# Patient Record
Sex: Female | Born: 1975 | Race: Asian | Hispanic: No | Marital: Married | Smoking: Never smoker
Health system: Southern US, Community
[De-identification: ages and names within clinical notes are randomized; demographics above are authoritative.]

## PROBLEM LIST (undated history)

## (undated) DIAGNOSIS — K219 Gastro-esophageal reflux disease without esophagitis: Secondary | ICD-10-CM

## (undated) DIAGNOSIS — T7840XA Allergy, unspecified, initial encounter: Secondary | ICD-10-CM

## (undated) DIAGNOSIS — D649 Anemia, unspecified: Secondary | ICD-10-CM

## (undated) DIAGNOSIS — B1910 Unspecified viral hepatitis B without hepatic coma: Secondary | ICD-10-CM

## (undated) DIAGNOSIS — B181 Chronic viral hepatitis B without delta-agent: Secondary | ICD-10-CM

## (undated) DIAGNOSIS — Z6829 Body mass index (BMI) 29.0-29.9, adult: Secondary | ICD-10-CM

## (undated) HISTORY — DX: Unspecified viral hepatitis B without hepatic coma: B19.10

## (undated) HISTORY — DX: Anemia, unspecified: D64.9

## (undated) HISTORY — DX: Gastro-esophageal reflux disease without esophagitis: K21.9

## (undated) HISTORY — DX: Body mass index (BMI) 29.0-29.9, adult: Z68.29

## (undated) HISTORY — DX: Chronic viral hepatitis B without delta-agent: B18.1

## (undated) HISTORY — DX: Allergy, unspecified, initial encounter: T78.40XA

---

## 2008-08-04 ENCOUNTER — Encounter: Payer: Self-pay | Admitting: Family Medicine

## 2008-08-06 ENCOUNTER — Encounter: Payer: Self-pay | Admitting: Family Medicine

## 2009-02-07 ENCOUNTER — Ambulatory Visit: Payer: Self-pay | Admitting: Family Medicine

## 2009-02-07 ENCOUNTER — Encounter: Payer: Self-pay | Admitting: Family Medicine

## 2009-02-07 DIAGNOSIS — B181 Chronic viral hepatitis B without delta-agent: Secondary | ICD-10-CM | POA: Insufficient documentation

## 2009-02-07 DIAGNOSIS — E663 Overweight: Secondary | ICD-10-CM | POA: Insufficient documentation

## 2009-02-08 LAB — CONVERTED CEMR LAB
Alkaline Phosphatase: 64 units/L (ref 39–117)
CO2: 23 meq/L (ref 19–32)
Creatinine, Ser: 0.71 mg/dL (ref 0.40–1.20)
Direct LDL: 86 mg/dL
Eosinophils Absolute: 0.3 10*3/uL (ref 0.0–0.7)
Eosinophils Relative: 2 % (ref 0–5)
Glucose, Bld: 88 mg/dL (ref 70–99)
Hep B S Ab: NEGATIVE
Lymphocytes Relative: 30 % (ref 12–46)
MCHC: 30.8 g/dL (ref 30.0–36.0)
Monocytes Relative: 8 % (ref 3–12)
Platelets: 304 10*3/uL (ref 150–400)
RBC: 5.69 M/uL — ABNORMAL HIGH (ref 3.87–5.11)
Total Bilirubin: 0.4 mg/dL (ref 0.3–1.2)
WBC: 10.2 10*3/uL (ref 4.0–10.5)

## 2009-02-09 ENCOUNTER — Encounter: Payer: Self-pay | Admitting: Family Medicine

## 2009-02-09 ENCOUNTER — Ambulatory Visit: Payer: Self-pay | Admitting: Family Medicine

## 2009-02-15 LAB — CONVERTED CEMR LAB: Hepatitis B DNA (Calc): >64020000 copies/mL — ABNORMAL HIGH (ref <169)

## 2009-03-16 ENCOUNTER — Encounter: Payer: Self-pay | Admitting: Family Medicine

## 2009-03-22 ENCOUNTER — Encounter: Admission: RE | Admit: 2009-03-22 | Discharge: 2009-03-22 | Payer: Self-pay | Admitting: Gastroenterology

## 2009-03-31 ENCOUNTER — Encounter: Payer: Self-pay | Admitting: Family Medicine

## 2009-04-12 ENCOUNTER — Ambulatory Visit (HOSPITAL_COMMUNITY): Admission: RE | Admit: 2009-04-12 | Discharge: 2009-04-12 | Payer: Self-pay | Admitting: Gastroenterology

## 2009-04-13 ENCOUNTER — Ambulatory Visit (HOSPITAL_COMMUNITY): Admission: RE | Admit: 2009-04-13 | Discharge: 2009-04-13 | Payer: Self-pay | Admitting: Gastroenterology

## 2009-05-06 ENCOUNTER — Encounter: Payer: Self-pay | Admitting: Family Medicine

## 2009-06-10 ENCOUNTER — Encounter: Payer: Self-pay | Admitting: Family Medicine

## 2009-06-14 ENCOUNTER — Encounter: Payer: Self-pay | Admitting: Family Medicine

## 2009-06-14 ENCOUNTER — Ambulatory Visit: Payer: Self-pay | Admitting: Family Medicine

## 2009-06-15 ENCOUNTER — Encounter: Payer: Self-pay | Admitting: Family Medicine

## 2009-06-16 LAB — CONVERTED CEMR LAB
Antibody Screen: NEGATIVE
Basophils Relative: 1 % (ref 0–1)
Hepatitis B Surface Ag: POSITIVE — AB
Lymphocytes Relative: 28 % (ref 12–46)
Lymphs Abs: 2.9 10*3/uL (ref 0.7–4.0)
MCHC: 30.2 g/dL (ref 30.0–36.0)
Monocytes Relative: 7 % (ref 3–12)
Neutro Abs: 6.5 10*3/uL (ref 1.7–7.7)
Neutrophils Relative %: 63 % (ref 43–77)
RBC: 5.16 M/uL — ABNORMAL HIGH (ref 3.87–5.11)
Rubella: 72.7 intl units/mL — ABNORMAL HIGH
Sickle Cell Screen: NEGATIVE

## 2009-07-08 ENCOUNTER — Other Ambulatory Visit: Admission: RE | Admit: 2009-07-08 | Discharge: 2009-07-08 | Payer: Self-pay | Admitting: Family Medicine

## 2009-07-08 ENCOUNTER — Encounter: Payer: Self-pay | Admitting: Family Medicine

## 2009-07-08 ENCOUNTER — Ambulatory Visit: Payer: Self-pay | Admitting: Family Medicine

## 2009-07-08 LAB — CONVERTED CEMR LAB: GC Probe Amp, Genital: NEGATIVE

## 2009-07-28 ENCOUNTER — Ambulatory Visit: Payer: Self-pay | Admitting: Family Medicine

## 2009-08-11 ENCOUNTER — Encounter: Payer: Self-pay | Admitting: Family Medicine

## 2009-08-11 ENCOUNTER — Ambulatory Visit: Payer: Self-pay | Admitting: Family Medicine

## 2009-08-25 ENCOUNTER — Ambulatory Visit: Payer: Self-pay | Admitting: Family Medicine

## 2009-08-26 ENCOUNTER — Encounter: Payer: Self-pay | Admitting: Family Medicine

## 2009-08-26 ENCOUNTER — Ambulatory Visit (HOSPITAL_COMMUNITY): Admission: RE | Admit: 2009-08-26 | Discharge: 2009-08-26 | Payer: Self-pay | Admitting: Family Medicine

## 2009-09-09 ENCOUNTER — Encounter: Payer: Self-pay | Admitting: Family Medicine

## 2009-09-19 ENCOUNTER — Ambulatory Visit: Payer: Self-pay | Admitting: Family Medicine

## 2009-10-13 ENCOUNTER — Ambulatory Visit: Payer: Self-pay | Admitting: Family Medicine

## 2009-10-27 ENCOUNTER — Ambulatory Visit: Payer: Self-pay | Admitting: Family Medicine

## 2009-10-27 ENCOUNTER — Encounter: Payer: Self-pay | Admitting: Family Medicine

## 2009-10-27 LAB — CONVERTED CEMR LAB
ALT: 25 units/L (ref 0–35)
Albumin: 3.5 g/dL (ref 3.5–5.2)
Calcium: 9 mg/dL (ref 8.4–10.5)
Glucose, Bld: 166 mg/dL — ABNORMAL HIGH (ref 70–99)
MCHC: 31.5 g/dL (ref 30.0–36.0)
MCV: 73.2 fL — ABNORMAL LOW (ref 78.0–100.0)
Platelets: 274 10*3/uL (ref 150–400)
RBC: 4.73 M/uL (ref 3.87–5.11)
RDW: 14.7 % (ref 11.5–15.5)
Sodium: 136 meq/L (ref 135–145)
Total Bilirubin: 0.3 mg/dL (ref 0.3–1.2)
Total Protein: 7 g/dL (ref 6.0–8.3)

## 2009-10-31 ENCOUNTER — Ambulatory Visit: Payer: Self-pay | Admitting: Family Medicine

## 2009-10-31 ENCOUNTER — Encounter: Payer: Self-pay | Admitting: Family Medicine

## 2009-11-09 ENCOUNTER — Ambulatory Visit: Payer: Self-pay | Admitting: Family Medicine

## 2009-11-23 ENCOUNTER — Ambulatory Visit: Payer: Self-pay | Admitting: Family Medicine

## 2009-12-07 ENCOUNTER — Ambulatory Visit: Payer: Self-pay | Admitting: Family Medicine

## 2009-12-07 DIAGNOSIS — K219 Gastro-esophageal reflux disease without esophagitis: Secondary | ICD-10-CM | POA: Insufficient documentation

## 2009-12-28 ENCOUNTER — Ambulatory Visit: Payer: Self-pay | Admitting: Family Medicine

## 2009-12-28 ENCOUNTER — Encounter: Payer: Self-pay | Admitting: Family Medicine

## 2009-12-28 LAB — CONVERTED CEMR LAB

## 2009-12-29 ENCOUNTER — Encounter: Payer: Self-pay | Admitting: Family Medicine

## 2010-01-02 ENCOUNTER — Encounter: Payer: Self-pay | Admitting: Family Medicine

## 2010-01-05 ENCOUNTER — Ambulatory Visit: Payer: Self-pay | Admitting: Family Medicine

## 2010-01-17 ENCOUNTER — Ambulatory Visit: Payer: Self-pay | Admitting: Family Medicine

## 2010-01-24 ENCOUNTER — Ambulatory Visit: Payer: Self-pay | Admitting: Family Medicine

## 2010-01-29 ENCOUNTER — Inpatient Hospital Stay (HOSPITAL_COMMUNITY)
Admission: AD | Admit: 2010-01-29 | Discharge: 2010-01-31 | Payer: Self-pay | Source: Home / Self Care | Attending: Obstetrics & Gynecology | Admitting: Obstetrics & Gynecology

## 2010-02-01 ENCOUNTER — Ambulatory Visit: Payer: Self-pay | Admitting: Obstetrics and Gynecology

## 2010-02-02 ENCOUNTER — Ambulatory Visit: Payer: Self-pay

## 2010-02-02 ENCOUNTER — Encounter: Payer: Self-pay | Admitting: Family Medicine

## 2010-02-03 ENCOUNTER — Encounter: Payer: Self-pay | Admitting: Family Medicine

## 2010-02-26 ENCOUNTER — Encounter: Payer: Self-pay | Admitting: Gastroenterology

## 2010-03-07 NOTE — Assessment & Plan Note (Signed)
Summary: ob visit/eo   Vital Signs:  Patient profile:   35 year old female Weight:      136.4 pounds BP sitting:   105 / 74  Primary Care Provider:  [name]   History of Present Illness: Brenda Brady presents to clinic today at 29 weeks for routine OB check. Well no issues. Taking HepB medication See flowsheet.  Habits & Providers  Alcohol-Tobacco-Diet     Alcohol drinks/day: 0     Tobacco Status: never     Cigarette Packs/Day: n/a  Current Medications (verified): 1)  Prenatal/folic Acid  Tabs (Prenatal Vit-Fe Fumarate-Fa) .Marland Kitchen.. 1 Tab By Mouth Daily 2)  Tyzeka 600 Mg Tabs (Telbivudine) .Marland Kitchen.. 1 By Mouth Daily  Allergies (verified): No Known Drug Allergies  Past History:  Past Medical History: Last updated: 07/18/2009 Currently G3P2002 Hepatitis B (2004) ---  Managed by Dr. Santa Brady.  Was on anti-viral therapy that was stoped with pregnancy.  Will plan to restart Telbivudine or Lemivudine at start of 3rd trimester.   Past Surgical History: Last updated: 02/07/2009 None  Social History: Last updated: 02/07/2009 Immigrant from [place] in 04/2008. Lives with husband (Brenda Brady), son Brenda Brady), and daughter Brenda Brady). Unemployed No tobacco No EtOH No drugs No regular exercise Best Contact# [phone] Brenda Brady - cousin)  Review of Systems  The patient denies anorexia, fever, weight loss, and abdominal pain.    Physical Exam  General:  VS noted.  Well NAD   Impression & Recommendations:  Problem # 1:  PREGNANCY, MULTIGRAVIDA (ICD-V22.1) Doing well today at 29 weeks.  Plan to follow up in 2 weeks.  Orders: Flu Vaccine 38yrs + MEDICARE PATIENTS (Q2039) Medicaid OB visit - [facility] ([id number])  Problem # 2:  HEPATITIS B CARRIER (ICD-V02.61) On medication. No side effects. No issues. Plan follow up as above.  Avoid FSE.IUPC in labor and infant gets HepB vaccine and IVIG at delivery.   Complete Medication List: 1)  Prenatal/folic Acid Tabs (Prenatal vit-fe  fumarate-fa) .Marland Kitchen.. 1 tab by mouth daily 2)  Tyzeka 600 Mg Tabs (Telbivudine) .Marland Kitchen.. 1 by mouth daily   OB Initial Intake Information    Positive HCG by: self    Race: Oriental/Asian    Marital status: Married    Occupation: homemaker    Number of children at home: 2    Hospital of delivery: In [place]    Newborn's physician: [facility]  FOB Information    Husband/Father of baby: Brenda Brady    FOB occupation Works  Menstrual History    LMP (date): 04/20/2009    LMP - Character: normal    Menarche: 13 years    Menses interval: 28 days    On BCP's at conception: no   Flowsheet View for Follow-up Visit    Estimated weeks of       gestation:     29 0/7    Weight:     136.4    Blood pressure:   105 / 74    Headache:     No    Nausea/vomiting:   No    Edema:     0    Vaginal bleeding:   no    Vaginal discharge:   no    Fundal height:      29    FHR:       145    Fetal activity:     yes    Labor symptoms:   no    Taking prenatal vits?  Y    Smoking:     n/a    Next visit:     2 wk    Resident:     Brenda Brady    Preceptor:     Brenda Brady   Appended Document: ob visit/eo   Immunizations Administered:  Influenza Vaccine # 1:    Vaccine Type: Fluvax Non-MCR    Site: left deltoid    Mfr: GlaxoSmithKline    Dose: 0.5 ml    Route: IM    Given by: Brenda Brady CMA    Exp. Date: 08/02/2010    Lot #: [id number]    VIS given: 08/30/09 version given November 09, 2009.  Flu Vaccine Consent Questions:    Do you have a history of severe allergic reactions to this vaccine? no    Any prior history of allergic reactions to egg and/or gelatin? no    Do you have a sensitivity to the preservative Thimersol? no    Do you have a past history of Guillan-Barre Syndrome? no    Do you currently have an acute febrile illness? no    Have you ever had a severe reaction to latex? no    Vaccine information given and explained to patient? yes    Are you currently pregnant? yes

## 2010-03-07 NOTE — Assessment & Plan Note (Signed)
Summary: ob visit/eo   Vital Signs:  Patient profile:   35 year old female Weight:      126.8 pounds Temp:     98.2 degrees F oral Pulse rate:   83 / minute Pulse rhythm:   regular BP sitting:   107 / 68  (left arm) Cuff size:   regular  Vitals Entered By: Loralee Pacas CMA (July 28, 2009 11:26 AM)  Habits & Providers  Alcohol-Tobacco-Diet     Cigarette Packs/Day: n/a  Allergies: No Known Drug Allergies   Impression & Recommendations:  Problem # 1:  PREGNANCY, MULTIGRAVIDA (ICD-V22.1) Assessment Unchanged RTC in 4 weeks.  pt desires genetic screen.  ordered Quad screen for 16 weeks.  pt to try tums for nausea/vomitting.  anatomy scan to schedule for 18 weeks.  pt is following with GI for her Hep B.   Orders: Medicaid OB visit - [facility] ([id number]) Prenatal U/S > 14 weeks - 82956 (Prenatal U/S)Future Orders: AFP/Quad Scr-[facility] ([id number]) ... 07/20/2010  Complete Medication List: 1)  Prenatal/folic Acid Tabs (Prenatal vit-fe fumarate-fa) .Marland Kitchen.. 1 tab by mouth daily  Patient Instructions: 1)  It was nice to see you today. 2)  Please come back in 4 weeks for your appointment with Dr. [name] 3)  Please come in 2 weeks for a lab appointment for your genetic screen 4)  For your vomitting--try Tums when you have the acid feeling before you throw up. 5)  For your sneezing--you can use claritin or benedryl if it is bothering you.  benedryl may make you sleepy, take it at night.  Prenatal Visit Concerns noted: none     Flowsheet View for Follow-up Visit    Estimated weeks of       gestation:     14 1/7    Weight:     126.8    Blood pressure:   107 / 68    Headache:     No    Nausea/vomiting:   vom1/d    Edema:     0    Vaginal bleeding:   no    Vaginal discharge:   no    FHR:       160s    Labor symptoms:   no    Smoking:     n/a    Next visit:     4 wk    Resident:     Hulen Luster    Preceptor:     [place]     OB Initial Intake Information    Positive HCG by: self  Race: Oriental/Asian    Marital status: Married    Occupation: homemaker    Number of children at home: 2    Hospital of delivery: In [place]    Newborn's physician: [facility]  FOB Information    Husband/Father of baby: [name]    FOB occupation Works  Menstrual History    LMP (date): 04/20/2009    LMP - Character: normal    Menarche: 13 years    Menses interval: 28 days    On BCP's at conception: no   Appended Document: ob visit/eo I saw Brenda. Brenda Brady with Dr. Ayesha Mohair and agree with her care.  Brenda Brady is Hep BSAg + with high viral loads, asymptomatic and co-followed by GI.  discussed with OB service.  Pregnancy to be managed here with input from GI and delivery and infant care to follow HepBpositive guidelines.

## 2010-03-07 NOTE — Assessment & Plan Note (Signed)
Summary: f/u eo   Vital Signs:  Patient profile:   35 year old female Weight:      142 pounds Pulse rate:   92 / minute BP sitting:   97 / 67  (left arm) Cuff size:   regular  Vitals Entered By: Tessie Fass CMA (January 05, 2010 9:41 AM)  Primary Care Provider:  [name]  CC:  OB visit.  History of Present Illness: Ms Tory presents to clinic today at 73.1. She feels well and has no questions. See flowsheet.  Occasional non painful irregular contraction <34min. Not like labor.   Habits & Providers  Alcohol-Tobacco-Diet     Cigarette Packs/Day: n/a  Current Problems (verified): 1)  Esophageal Reflux  (ICD-530.81) 2)  Pregnancy, Multigravida  (ICD-V22.1) 3)  Health Maintenance Exam  (ICD-V70.0) 4)  Overweight  (ICD-278.02) 5)  Hepatitis B Carrier  (ICD-V02.61)  Current Medications (verified): 1)  Prenatal/folic Acid  Tabs (Prenatal Vit-Fe Fumarate-Fa) .Marland Kitchen.. 1 Tab By Mouth Daily 2)  Tyzeka 600 Mg Tabs (Telbivudine) .Marland Kitchen.. 1 By Mouth Daily 3)  Omeprazole 20 Mg Cpdr (Omeprazole) .Marland Kitchen.. 1 By Mouth Daily  Allergies (verified): No Known Drug Allergies  Past History:  Past Medical History: Last updated: 07/18/2009 Currently G3P2002 Hepatitis B (2004) ---  Managed by Dr. Santa Genera.  Was on anti-viral therapy that was stoped with pregnancy.  Will plan to restart Telbivudine or Lemivudine at start of 3rd trimester.   Family History: Last updated: 02/07/2009 Mother- HTN Father- Healthy  Social History: Last updated: 02/07/2009 Immigrant from [place] in 04/2008. Lives with husband ([name]), son Breianna Delfino), and daughter Iesha Summerhill). Unemployed No tobacco No EtOH No drugs No regular exercise Best Contact# [phone] [name] Villafuerte - cousin)  Risk Factors: Smoking Status: never (12/28/2009) Packs/Day: n/a (01/05/2010)  Review of Systems  The patient denies anorexia, fever, and weight loss.    Physical Exam  General:  VS noted.  Well NAD Abdomen:  36 cm.  Vertex Extremities:  Non edemetus BL LE   Impression & Recommendations:  Problem # 1:  PREGNANCY, MULTIGRAVIDA (ICD-V22.1) Assessment Unchanged  Doing well 37.1. Plan follow up in 1 week.  GBS pos. Will use PCN when in labor.  Labor precuations reviewed.   Orders: Medicaid OB visit - [facility] ([id number])  Problem # 2:  HEPATITIS B CARRIER (ICD-V02.61) Assessment: Unchanged On medication. No side effects. No issues. Plan follow up as above.  Avoid FSE/IUPC in labor and infant gets HepB vaccine and IVIG at delivery.  Liver enzymes WNL   Complete Medication List: 1)  Prenatal/folic Acid Tabs (Prenatal vit-fe fumarate-fa) .Marland Kitchen.. 1 tab by mouth daily 2)  Tyzeka 600 Mg Tabs (Telbivudine) .Marland Kitchen.. 1 by mouth daily 3)  Omeprazole 20 Mg Cpdr (Omeprazole) .Marland Kitchen.. 1 by mouth daily  Patient Instructions: 1)  Thank you for seeing me today. 2)  If you have vaginal bleeding, suspect your water has broken (large gush of fluid or continuous slow trickle from vagina) or you have more than 5 contractions per hour for 2 hours straight, please go directly to the MAU. 3)  Every morning and every evening evaluate whether you think the baby is moving enough.  If not, then do kick counts as follows.  Sit in a quiet place and count each movement.  If you get to 5 baby movements in an hour you can stop.  If at 1 hour you have less than 5 movements, keep counting for another hour.  If you don't get 10  movements in these 2 hours, you should go to the MAU or call the clinic. 4)  Come back in 1 week.   Orders Added: 1)  Medicaid OB visit - [facility] [[id number]]     Flowsheet View for Follow-up Visit    Estimated weeks of       gestation:     37 1/7    Weight:     142    Blood pressure:   97 / 67    Headache:     No    Nausea/vomiting:   No    Edema:     0    Vaginal bleeding:   no    Vaginal discharge:   d/c    Fundal height:      36    FHR:       160    Fetal activity:     yes    Labor symptoms:   few ctx    Fetal  position:     vertex    Taking prenatal vits?   Y    Smoking:     n/a    Next visit:     1 wk    Resident:     [name]

## 2010-03-07 NOTE — Assessment & Plan Note (Signed)
Summary: ob visit/eo   Vital Signs:  Patient profile:   35 year old female Weight:      139.9 pounds BP sitting:   125 / 79  Primary Care Provider:  [name]   History of Present Illness: Ms Brenda Brady presents to clinic today to for OB follow up.  She notes reflux presenting as substernal burning and  a sour taste in her mouth. She denies any chest pain or pressure or radiating pain. She denies any dyspnea. She states that her pain improves for a few hours with Tums.  Otherwise she feels well. See flowsheet.   Habits & Providers  Alcohol-Tobacco-Diet     Alcohol drinks/day: 0     Tobacco Status: never     Cigarette Packs/Day: n/a  Current Problems (verified): 1)  Esophageal Reflux  (ICD-530.81) 2)  Pregnancy, Multigravida  (ICD-V22.1) 3)  Health Maintenance Exam  (ICD-V70.0) 4)  Overweight  (ICD-278.02) 5)  Hepatitis B Carrier  (ICD-V02.61)  Current Medications (verified): 1)  Prenatal/folic Acid  Tabs (Prenatal Vit-Fe Fumarate-Fa) .Marland Kitchen.. 1 Tab By Mouth Daily 2)  Tyzeka 600 Mg Tabs (Telbivudine) .Marland Kitchen.. 1 By Mouth Daily 3)  Omeprazole 20 Mg Cpdr (Omeprazole) .Marland Kitchen.. 1 By Mouth Daily  Allergies (verified): No Known Drug Allergies  Past History:  Past Medical History: Last updated: 07/18/2009 Currently G3P2002 Hepatitis B (2004) ---  Managed by Dr. Santa Genera.  Was on anti-viral therapy that was stoped with pregnancy.  Will plan to restart Telbivudine or Lemivudine at start of 3rd trimester.   Past Surgical History: Last updated: 02/07/2009 None  Social History: Last updated: 02/07/2009 Immigrant from [place] in 04/2008. Lives with husband ([name]), son Rozlyn Yerby), and daughter Adaleah Forget). Unemployed No tobacco No EtOH No drugs No regular exercise Best Contact# [phone] [name] Hoopingarner - cousin)  Risk Factors: Smoking Status: never (12/07/2009) Packs/Day: n/a (12/07/2009)  Social History: Reviewed history from 02/07/2009 and no changes required. Immigrant from  [place] in 04/2008. Lives with husband ([name]), son Thurza Kwiecinski), and daughter Laelia Angelo). Unemployed No tobacco No EtOH No drugs No regular exercise Best Contact# [phone] (Ysuing Winegar - cousin)  Review of Systems  The patient denies anorexia, fever, weight loss, hoarseness, chest pain, syncope, dyspnea on exertion, prolonged cough, headaches, abdominal pain, severe indigestion/heartburn, genital sores, difficulty walking, and depression.    Physical Exam  General:  Vs noted.  Well NAD Eyes:  No corneal or conjunctival inflammation noted. EOMI. Vision grossly normal. Ears:  no external deformities.   Nose:  no external erythema.   Mouth:  good dentition and pharynx pink and moist.   Lungs:  Normal respiratory effort, chest expands symmetrically. Lungs are clear to auscultation, no crackles or wheezes. Heart:  Normal rate and regular rhythm. S1 and S2 normal without gallop, murmur, click, rub or other extra sounds. Abdomen:  Bowel sounds positive,abdomen soft and non-tender without masses, organomegaly or hernias noted. Abd 33 cm Extremities:  Non edemetus BL LE Cervical Nodes:  No lymphadenopathy noted Axillary Nodes:  No palpable lymphadenopathy   Impression & Recommendations:  Problem # 1:  PREGNANCY, MULTIGRAVIDA (ICD-V22.1)  Doing well at 33 weeks.  Plan to follow up in 2 week for OB visit.  See below for sepcific problems.  Orders: Medicaid OB visit - [facility] ([id number])  Problem # 2:  ESOPHAGEAL REFLUX (ICD-530.81)  Due to pregnancy.  Plan to start omeprazole and follow up in 2 weeks. Discussed indications of this medication and red flags with patient who expressed  understanding. Used Nurse, learning disability.  Her updated medication list for this problem includes:    Omeprazole 20 Mg Cpdr (Omeprazole) .Marland Kitchen... 1 by mouth daily  Orders: Medicaid OB visit - [facility] ([id number])  Problem # 3:  HEPATITIS B CARRIER (ICD-V02.61)  On medication. No side effects. No issues. Plan follow up as  above.  Avoid FSE/IUPC in labor and infant gets HepB vaccine and IVIG at delivery.  Liver enzymes WNL last months.   Orders: Medicaid OB visit - [facility] ([id number])  Complete Medication List: 1)  Prenatal/folic Acid Tabs (Prenatal vit-fe fumarate-fa) .Marland Kitchen.. 1 tab by mouth daily 2)  Tyzeka 600 Mg Tabs (Telbivudine) .Marland Kitchen.. 1 by mouth daily 3)  Omeprazole 20 Mg Cpdr (Omeprazole) .Marland Kitchen.. 1 by mouth daily Prescriptions: OMEPRAZOLE 20 MG CPDR (OMEPRAZOLE) 1 by mouth daily  #30 x 6   Entered and Authorized by:   [name]   Signed by:   [name] on 12/07/2009   Method used:   [facility] to        [facility] [address] [phone]* (retail)       [address]       [place], [place]  [address]       Ph: [phone] or [phone]       Fax: [phone]   RxID:   [id number] PRENATAL/FOLIC ACID  TABS (PRENATAL VIT-FE FUMARATE-FA) 1 tab by mouth daily  #30 x 12   Entered and Authorized by:   [name]   Signed by:   [name] on 12/07/2009   Method used:   [facility] to        [facility] [address] [phone]* (retail)       [address]       [place], [place]  [address]       Ph: [phone] or [phone]       Fax: [phone]   RxID:   [id number]    Orders Added: 1)  Medicaid OB visit - [facility] [[id number]]     OB Initial Intake Information    Positive HCG by: self    Race: Oriental/Asian    Marital status: Married    Occupation: homemaker    Number of children at home: 2    Hospital of delivery: In [place]    Newborn's physician: [facility]  FOB Information    Husband/Father of baby: [name]    FOB occupation Works  Menstrual History    LMP (date): 04/20/2009    LMP - Character: normal    Menarche: 13 years    Menses interval: 28 days    On BCP's at conception: no   Flowsheet View for Follow-up Visit    Estimated weeks of       gestation:     33 0/7    Weight:     139.9    Blood pressure:   125 / 79     Headache:     No    Nausea/vomiting:   No    Edema:     0    Vaginal bleeding:   no    Vaginal discharge:   no    Fetal activity:     yes    Labor symptoms:   no    Smoking:     n/a    Next visit:     2 wk    Resident:  [name]

## 2010-03-07 NOTE — Assessment & Plan Note (Signed)
Summary: ob visit/eo   Vital Signs:  Patient profile:   35 year old female Height:      58.25 inches Weight:      133 pounds Pulse rate:   96 / minute BP sitting:   96 / 76  (left arm) Cuff size:   regular  Vitals Entered By: Tessie Fass CMA (October 13, 2009 9:02 AM) CC: OB Visit Pain Assessment Patient in pain? no        Primary Care Provider:  [name]  CC:  OB Visit.  History of Present Illness: Ob visit 25.1 weeks. No issue doing well.   Habits & Providers  Alcohol-Tobacco-Diet     Alcohol drinks/day: 0     Tobacco Status: never  Current Problems (verified): 1)  Pregnancy, Multigravida  (ICD-V22.1) 2)  Health Maintenance Exam  (ICD-V70.0) 3)  Overweight  (ICD-278.02) 4)  Hepatitis B Carrier  (ICD-V02.61)  Current Medications (verified): 1)  Prenatal/folic Acid  Tabs (Prenatal Vit-Fe Fumarate-Fa) .Marland Kitchen.. 1 Tab By Mouth Daily 2)  Tyzeka 600 Mg Tabs (Telbivudine) .Marland Kitchen.. 1 By Mouth Daily  Allergies (verified): No Known Drug Allergies  Past History:  Past Medical History: Last updated: 07/18/2009 Currently G3P2002 Hepatitis B (2004) ---  Managed by Dr. Santa Genera.  Was on anti-viral therapy that was stoped with pregnancy.  Will plan to restart Telbivudine or Lemivudine at start of 3rd trimester.   Family History: Last updated: 02/07/2009 Mother- HTN Father- Healthy  Social History: Last updated: 02/07/2009 Immigrant from [place] in 04/2008. Lives with husband ([name]), son Willa Brocks), and daughter Parisha Beaulac). Unemployed No tobacco No EtOH No drugs No regular exercise Best Contact# [phone] [name] [place] - cousin)  Risk Factors: Smoking Status: never (10/13/2009) Packs/Day: n/a (09/19/2009)  Review of Systems       See flowsheet no issues.   Physical Exam  General:  Vs noted and recheck.    Impression & Recommendations:  Problem # 1:  PREGNANCY, MULTIGRAVIDA (ICD-V22.1)  Doing well no issues.  Plan RTC in 2 weeks for labs and  in 4 for a MD visit.   Orders: Medicaid OB visit - [facility] ([id number])Future Orders: CBC-[facility] ([id number]) ... 11/04/2010 HIV-[facility] ([id number]) ... 11/04/2010 RPR-[facility] [id number]) ... 11/04/2010  Problem # 2:  HEPATITIS B CARRIER (ICD-V02.61)  Plan to start Telbivudine 600 once daily per GI today and continue through pregnancy.   Future Orders: Comp Met-[facility] [id number]) ... 11/04/2010  Complete Medication List: 1)  Prenatal/folic Acid Tabs (Prenatal vit-fe fumarate-fa) .Marland Kitchen.. 1 tab by mouth daily 2)  Tyzeka 600 Mg Tabs (Telbivudine) .Marland Kitchen.. 1 by mouth daily Prescriptions: TYZEKA 600 MG TABS (TELBIVUDINE) 1 by mouth daily  #30 x 6   Entered and Authorized by:   [name]   Signed by:   [name] on 10/13/2009   Method used:   [facility]ically to        [facility] [address] [phone]* (retail)       [address]       [place], [place]  [address]       Ph: [phone] or [phone]       Fax: [phone]   RxID:   [id number]    OB Initial Intake Information    Positive HCG by: self    Race: Oriental/Asian    Marital status: Married    Occupation: homemaker    Number of children at home: 2    Hospital of delivery:  In [place]    Newborn's physician: [facility]  FOB Information    Husband/Father of baby: [name]    FOB occupation Works  Menstrual History    LMP (date): 04/20/2009    LMP - Character: normal    Menarche: 13 years    Menses interval: 28 days    On BCP's at conception: no   Flowsheet View for Follow-up Visit    Estimated weeks of       gestation:     25 1/7    Weight:     133    Blood pressure:   96 / 76   Appended Document: ob visit/eo    Clinical Lists Changes  Orders: Added new Test order of Glucose 1 hr-[facility] ([id number]) - Signed

## 2010-03-07 NOTE — Consult Note (Signed)
Summary: [facility]   Imported By: De Nurse 04/13/2009 13:40:32  _____________________________________________________________________  External Attachment:    Type:   Image     Comment:   External Document  Appended Document: [facility] Reviewed.  Pending liver bx.

## 2010-03-07 NOTE — Consult Note (Signed)
Summary: [facility]   Imported By: De Nurse 07/13/2009 10:36:45  _____________________________________________________________________  External Attachment:    Type:   Image     Comment:   External Document  Appended Document: [facility] Reviewed and will forward to Dr. [name] to review management directions.  Appended Document: Update to [facility] to reflect OB plan     Allergies: No Known Drug Allergies  Past History:  Past Medical History: Currently G3P2002 Hepatitis B (2004) ---  Managed by Dr. Santa Genera.  Was on anti-viral therapy that was stoped with pregnancy.  Will plan to restart Telbivudine or Lemivudine at start of 3rd trimester.    Complete Medication List: 1)  Prenatal/folic Acid Tabs (Prenatal vit-fe fumarate-fa) .Marland Kitchen.. 1 tab by mouth daily

## 2010-03-07 NOTE — Assessment & Plan Note (Signed)
Summary: ob visit/eo   Vital Signs:  Patient profile:   35 year old female Height:      58.25 inches Weight:      137.6 pounds Temp:     97.6 degrees F oral Pulse rate:   111 / minute BP sitting:   114 / 80  Vitals Entered By: Jimmy Footman, CMA (November 23, 2009 9:24 AM)  Primary Care Provider:  [name]   History of Present Illness: Feels well no issues today.  Taking anti virals daily as well as PNV. See flowsheet.   Habits & Providers  Alcohol-Tobacco-Diet     Alcohol drinks/day: 0     Tobacco Status: never     Cigarette Packs/Day: n/a  Current Problems (verified): 1)  Pregnancy, Multigravida  (ICD-V22.1) 2)  Health Maintenance Exam  (ICD-V70.0) 3)  Overweight  (ICD-278.02) 4)  Hepatitis B Carrier  (ICD-V02.61)  Current Medications (verified): 1)  Prenatal/folic Acid  Tabs (Prenatal Vit-Fe Fumarate-Fa) .Marland Kitchen.. 1 Tab By Mouth Daily 2)  Tyzeka 600 Mg Tabs (Telbivudine) .Marland Kitchen.. 1 By Mouth Daily  Allergies (verified): No Known Drug Allergies  Past History:  Past Medical History: Last updated: 07/18/2009 Currently G3P2002 Hepatitis B (2004) ---  Managed by Dr. Santa Genera.  Was on anti-viral therapy that was stoped with pregnancy.  Will plan to restart Telbivudine or Lemivudine at start of 3rd trimester.   Past Surgical History: Last updated: 02/07/2009 None  Family History: Last updated: 02/07/2009 Mother- HTN Father- Healthy  Social History: Last updated: 02/07/2009 Immigrant from [place] in 04/2008. Lives with husband ([name]), son Sila Sarsfield), and daughter Kerria Sapien). Unemployed No tobacco No EtOH No drugs No regular exercise Best Contact# [phone] [name] Lortz - cousin)  Risk Factors: Smoking Status: never (11/23/2009) Packs/Day: n/a (11/23/2009)  Review of Systems       See flowsheet  Physical Exam  General:  VS noted.  Well woman in NAD Eyes:  No scleral icterus Abdomen:  Bowel sounds positive,abdomen soft and non-tender without  masses, organomegaly or hernias noted. Abd 30cm   Impression & Recommendations:  Problem # 1:  PREGNANCY, MULTIGRAVIDA (ICD-V22.1) Assessment Unchanged  Well woman.  No issues today.  Follow up 2 weeks.    Orders: Medicaid OB visit - [facility] ([id number])  Problem # 2:  HEPATITIS B CARRIER (ICD-V02.61) On medication. No side effects. No issues. Plan follow up as above.  Avoid FSE.IUPC in labor and infant gets HepB vaccine and IVIG at delivery.   Complete Medication List: 1)  Prenatal/folic Acid Tabs (Prenatal vit-fe fumarate-fa) .Marland Kitchen.. 1 tab by mouth daily 2)  Tyzeka 600 Mg Tabs (Telbivudine) .Marland Kitchen.. 1 by mouth daily  Patient Instructions: 1)  Thank you for seeing me today. 2)  Due date is 01/25/10 3)  If you have vaginal bleeding, suspect your water has broken (large gush of fluid or continuous slow trickle from vagina) or you have more than 5 contractions per hour for 2 hours straight, please go directly to the MAU. 4)  Every morning and every evening evaluate whether you think the baby is moving enough.  If not, then do kick counts as follows.  Sit in a quiet place and count each movement.  If you get to 5 baby movements in an hour you can stop.  If at 1 hour you have less than 5 movements, keep counting for another hour.  If you don't get 10 movements in these 2 hours, you should go to the MAU or call the clinic. 5)  Please schedule a follow-up appointment in 2 weeks.    Orders Added: 1)  Medicaid OB visit - [facility] [[id number]]     Flowsheet View for Follow-up Visit    Estimated weeks of       gestation:     31 0/7    Weight:     137.6    Blood pressure:   114 / 80    Fetal activity:       yes    Labor symptoms?   no    Smoking PPD:   n/a   Flowsheet View for Follow-up Visit    Estimated weeks of       gestation:     31 0/7    Weight:     137.6    Blood pressure:   114 / 80    Fetal activity:     yes    Labor symptoms:   no    Smoking:     n/a   OB Initial Intake Information     Positive HCG by: self    Race: Oriental/Asian    Marital status: Married    Occupation: homemaker    Number of children at home: 2    Hospital of delivery: In [place]    Newborn's physician: [facility]  FOB Information    Husband/Father of baby: [name]    FOB occupation Works  Menstrual History    LMP (date): 04/20/2009    LMP - Character: normal    Menarche: 13 years    Menses interval: 28 days    On BCP's at conception: no

## 2010-03-07 NOTE — Letter (Signed)
Summary: Handout Printed  Printed Handout:  - Prenatal-Record-[facility] 

## 2010-03-07 NOTE — Assessment & Plan Note (Signed)
Summary: ob visit,tcb   Vitals Entered By: Arlyss Repress CMA, (September 19, 2009 2:34 PM)  Primary Care Provider:  [name]   History of Present Illness: Ms Eisel presents to clinic today for an OB follow up. She has seen her GI doctor in the interum who is following in regards to her HepB. Plan to Start Telbivudine 600 daily at start of 3rd trimester. Will obtain HEP HBV Quantasure, HBe antigen, and CBC 1 month following initiation of Telbivudine.  OB doing well. See flowsheet.  Habits & Providers  Alcohol-Tobacco-Diet     Alcohol drinks/day: 0     Tobacco Status: never     Cigarette Packs/Day: n/a  Current Problems (verified): 1)  Pregnancy, Multigravida  (ICD-V22.1) 2)  Health Maintenance Exam  (ICD-V70.0) 3)  Overweight  (ICD-278.02) 4)  Hepatitis B Carrier  (ICD-V02.61)  Current Medications (verified): 1)  Prenatal/folic Acid  Tabs (Prenatal Vit-Fe Fumarate-Fa) .Marland Kitchen.. 1 Tab By Mouth Daily  Allergies (verified): No Known Drug Allergies  Past History:  Past Medical History: Reviewed history from 07/18/2009 and no changes required. Currently [id number] Hepatitis B (2004) ---  Managed by Dr. Santa Genera.  Was on anti-viral therapy that was stoped with pregnancy.  Will plan to restart Telbivudine or Lemivudine at start of 3rd trimester.   Past Surgical History: Reviewed history from 02/07/2009 and no changes required. None  Family History: Reviewed history from 02/07/2009 and no changes required. Mother- HTN Father- Healthy  Social History: Reviewed history from 02/07/2009 and no changes required. Immigrant from [place] in 04/2008. Lives with husband ([name]), son Barbie Croston), and daughter Pierrette Scheu). Unemployed No tobacco No EtOH No drugs No regular exercise Best Contact# [phone] (Ysuing Ericsson - cousin)  Review of Systems  The patient denies anorexia, weight loss, hoarseness, headaches, hemoptysis, abdominal pain, genital sores, and muscle weakness.     Physical Exam  General:  VS noted.  Well NAD Eyes:  No corneal or conjunctival inflammation noted. EOMI. Perrla.  Vision grossly normal. Lungs:  Normal respiratory effort, chest expands symmetrically. Lungs are clear to auscultation, no crackles or wheezes. Heart:  Normal rate and regular rhythm. S1 and S2 normal without gallop, murmur, click, rub or other extra sounds. Abdomen:  Bowel sounds positive,abdomen soft and non-tender without masses, organomegaly or hernias noted. Cervical Nodes:  No lymphadenopathy noted Axillary Nodes:  No palpable lymphadenopathy   Impression & Recommendations:  Problem # 1:  PREGNANCY, MULTIGRAVIDA (ICD-V22.1) Assessment Unchanged  Doing well. No issues other than HepB as below. Plan to follow up in 4 weeks.   Orders: Medicaid OB visit - [facility] ([id number])  Problem # 2:  HEPATITIS B CARRIER (ICD-V02.61) Assessment: Unchanged Plan to Start Telbivudine 600 daily at start of 3rd trimester. Will obtain HEP HBV Quantasure, HBe antigen, and CBC 1 month following initiation of Telbivudine. Infant gets Hb IgG and HepB vaccine at birth.  Complete Medication List: 1)  Prenatal/folic Acid Tabs (Prenatal vit-fe fumarate-fa) .Marland Kitchen.. 1 tab by mouth daily   OB Initial Intake Information    Positive HCG by: self    Race: Oriental/Asian    Marital status: Married    Occupation: homemaker    Number of children at home: 2    Hospital of delivery: In [place]    Newborn's physician: [facility]  FOB Information    Husband/Father of baby: [name]    FOB occupation Works  Menstrual History    LMP (date): 04/20/2009    LMP - Character: normal  Menarche: 13 years    Menses interval: 28 days    On BCP's at conception: no   Flowsheet View for Follow-up Visit    Estimated weeks of       gestation:     21 5/7    Headache:     No    Nausea/vomiting:   nausea    Edema:     0    Vaginal bleeding:   no    Vaginal discharge:   no    Fundal height:      21    FHR:        150    Fetal activity:     yes    Labor symptoms:   no    Fetal position:     ??    Taking prenatal vits?   Y    Smoking:     n/a    Next visit:     4 weeks    Resident:     [name]    Preceptor:     Mauricio Po

## 2010-03-07 NOTE — Assessment & Plan Note (Signed)
Summary: 7 5/7 wk new OB   Vital Signs:  Patient profile:   35 year old female Height:      58.25 inches Weight:      125.5 pounds BMI:     26.10 Pulse rate:   90 / minute BP sitting:   107 / 74  (left arm) Cuff size:   regular  Vitals Entered By: Gladstone Pih (Jun 14, 2009 8:47 AM) CC: ? Preg Is Patient Diabetic? No Pain Assessment Patient in pain? no        Primary Care Provider:  Marisue Ivan, MD  CC:  ? Preg.  History of Present Illness: 35yo F here b/c of positive home pregnancy test  Pregnancy: Positive home preg test on 05/27/2009.  LMP 04/20/2009.  Hx of regular periods.  She was previous G2P2002, all NSVD w/ a hx of positive Hepatitis B not on any medications but seen by the Heptologist in town.  Denies any abd/pelvic pain or vaginal bleeding.  Does endorse N/V.  Not on any medications currently.  Habits & Providers  Alcohol-Tobacco-Diet     Tobacco Status: never  Current Medications (verified): 1)  Prenatal/folic Acid  Tabs (Prenatal Vit-Fe Fumarate-Fa) .Marland Kitchen.. 1 Tab By Mouth Daily  Allergies (verified): No Known Drug Allergies  Past History:  Past Medical History: Currently G3P2002 Hepatitis B (2004)  Review of Systems      See HPI  Physical Exam  General:  VS Reviewed. Well appearing, NAD.  Lungs:  Normal respiratory effort, chest expands symmetrically. Lungs are clear to auscultation, no crackles or wheezes. Heart:  Normal rate and regular rhythm. S1 and S2 normal without gallop, murmur, click, rub or other extra sounds. Abdomen:  Soft, NT, ND, no HSM, active BS  Extremities:  no edema Neurologic:  no focal deficits Psych:  no signs or complaints of depression   Impression & Recommendations:  Problem # 1:  PREGNANCY, MULTIGRAVIDA (ICD-V22.1) Assessment New 35yo G3P2002 at 7 5/7 wks per LMP (04/20/2009) EDD: 01/25/2010 Positive Upreg at home (05/27/09) and positive upreg in the office today. Plan to obtain all prenatal labs  today. Started on prenatal vitamins. Will f/u with Dr. [name] for initial ob visit. Will need GC/Chl at that time.  Pap smear up to date (07/2008 nl)  Orders: Prenatal-[facility] ([id number]) Urine Culture-[facility] ([id number]) HIV-[facility] ([id number]) Sickle Cell Scr-[facility] ([id number]) Medicaid OB visit - [facility] ([id number])  Complete Medication List: 1)  Prenatal/folic Acid Tabs (Prenatal vit-fe fumarate-fa) .Marland Kitchen.. 1 tab by mouth daily  Other Orders: U Preg-[facility] ([id number])  Patient Instructions: 1)  Today you are 7 wks and 5 days. 2)  Schedule f/u appt with an intern in 1-2 weeks. 3)  They will discuss your blood work at that time. 4)  Call us if you have any problems or concerns. 5)  Start taking the prenatal vitamin today. Prescriptions: PRENATAL/FOLIC ACID  TABS (PRENATAL VIT-FE FUMARATE-FA) 1 tab by mouth daily  #100 x 11   Entered and Authorized by:   Marisue Ivan  MD   Signed by:   Marisue Ivan  MD on 06/14/2009   Method used:   Electronically to        [facility] [address] [phone]* (retail)       [address]       [place], [place]  [address]       Ph: [phone] or [phone]       Fax: [phone]   RxID:  [id number]    Laboratory Results   Urine Tests  Date/Time Received: Jun 14, 2009 8:53 AM  Date/Time Reported: Jun 14, 2009 9:03 AM     Urine HCG: positive Comments: ...............test performed by......Marland KitchenBonnie A. [place], MLS (ASCP)cm

## 2010-03-07 NOTE — Consult Note (Signed)
Summary: [facility]  [facility]   Imported By: De Nurse 05/16/2009 16:06:34  _____________________________________________________________________  External Attachment:    Type:   Image     Comment:   External Document  Appended Document: [facility] Reviewed.

## 2010-03-07 NOTE — Assessment & Plan Note (Signed)
Summary: ob visit/eo   Vital Signs:  Patient profile:   35 year old female Weight:      130 pounds BP sitting:   106 / 71  Vitals Entered By: Arlyss Repress CMA, (August 25, 2009 1:40 PM)  Primary Care Provider:  [name]  CC:  OB Visit.  History of Present Illness: Routine OB visit at 18 weeks. Doing well only mild heartburn. Has appt with hepatoligist Aug 1st for further eval and management of HepB. See flowsheet.  Habits & Providers  Alcohol-Tobacco-Diet     Alcohol drinks/day: 0     Tobacco Status: never     Cigarette Packs/Day: n/a  Current Problems (verified): 1)  Pregnancy, Multigravida  (ICD-V22.1) 2)  Health Maintenance Exam  (ICD-V70.0) 3)  Overweight  (ICD-278.02) 4)  Hepatitis B Carrier  (ICD-V02.61)  Current Medications (verified): 1)  Prenatal/folic Acid  Tabs (Prenatal Vit-Fe Fumarate-Fa) .Marland Kitchen.. 1 Tab By Mouth Daily  Allergies (verified): No Known Drug Allergies  Past History:  Past Medical History: Last updated: 07/18/2009 Currently G3P2002 Hepatitis B (2004) ---  Managed by Dr. Santa Genera.  Was on anti-viral therapy that was stoped with pregnancy.  Will plan to restart Telbivudine or Lemivudine at start of 3rd trimester.   Family History: Last updated: 02/07/2009 Mother- HTN Father- Healthy  Social History: Last updated: 02/07/2009 Immigrant from [place] in 04/2008. Lives with husband ([name]), son Rhema Boyett), and daughter Jaxyn Mestas). Unemployed No tobacco No EtOH No drugs No regular exercise Best Contact# [phone] [name] Bergevin - cousin)  Risk Factors: Smoking Status: never (08/25/2009) Packs/Day: n/a (08/25/2009)  Review of Systems       see flowsheet and hpi otherwise negative.  Physical Exam  General:  VS noted. Well see fhowsheet   Impression & Recommendations:  Problem # 1:  PREGNANCY, MULTIGRAVIDA (ICD-V22.1) Assessment Unchanged Awaiting quad screen results. Doing well no current issues. Will get [name] arranged at  [facility] and follow up results. HepB below. RTC 4 weeks. Glucola at 26 weeks.  Orders: Ultrasound (Ultrasound) Medicaid OB visit - [facility] ([id number])  Problem # 2:  HEPATITIS B CARRIER (ICD-V02.61) Assessment: Unchanged Chronic Hep B managed by Hepatoligy.  Pt has appt Aug 1st. Will follow. Planning on antiviral agents in 3rd trimester. Will review case with OB/GYN at 36 weeks in preperation for labor and delivery.   Complete Medication List: 1)  Prenatal/folic Acid Tabs (Prenatal vit-fe fumarate-fa) .Marland Kitchen.. 1 tab by mouth daily  Patient Instructions: 1)  Thank you for seeing me today. 2)  Please follow up with me in 4 weeks. 3)  If you have vaginal bleeding, suspect your water has broken (large gush of fluid or continuous slow trickle from vagina) or you have more than 5 contractions per hour for 1 hour straight, please go directly to the MAU.   OB Initial Intake Information    Positive HCG by: self    Race: Oriental/Asian    Marital status: Married    Occupation: homemaker    Number of children at home: 2    Hospital of delivery: In [place]    Newborn's physician: [facility]  FOB Information    Husband/Father of baby: [name]    FOB occupation Works  Menstrual History    LMP (date): 04/20/2009    LMP - Character: normal    Menarche: 13 years    Menses interval: 28 days    On BCP's at conception: no   Flowsheet View for Follow-up Visit    Estimated  weeks of       gestation:     18 1/7    Weight:     130    Blood pressure:   106 / 71    Headache:     No    Nausea/vomiting:   nausea    Edema:     0    Vaginal bleeding:   no    Vaginal discharge:   no    Fundal height:      18    FHR:       162    Fetal activity:     yes    Labor symptoms:   no    Fetal position:     ??    Taking prenatal vits?   Y    Smoking:     n/a    Next visit:     4 weeks    Resident:     [name]    Preceptor:     Sheffield Slider

## 2010-03-07 NOTE — Assessment & Plan Note (Signed)
Summary: ob visit/eo   Vital Signs:  Patient profile:   35 year old female Weight:      138.8 pounds BP sitting:   113 / 76  Vitals Entered By: Arlyss Repress CMA, (December 28, 2009 10:48 AM)  Primary Care Provider:  [name]  CC:  Ob visit.  History of Present Illness: Ms Azaleah is here for her 36 week OB visit. She feels well and has no complaints.  See flowsheet.  Mild short irreg contractions. Does not feel like labor.  Is taking her antivirals, omeprazole, and PNV. Happy Translator used today.  Habits & Providers  Alcohol-Tobacco-Diet     Alcohol drinks/day: 0     Tobacco Status: never     Cigarette Packs/Day: n/a  Current Problems (verified): 1)  Vaginal Discharge  (ICD-623.5) 2)  Esophageal Reflux  (ICD-530.81) 3)  Pregnancy, Multigravida  (ICD-V22.1) 4)  Health Maintenance Exam  (ICD-V70.0) 5)  Overweight  (ICD-278.02) 6)  Hepatitis B Carrier  (ICD-V02.61)  Current Medications (verified): 1)  Prenatal/folic Acid  Tabs (Prenatal Vit-Fe Fumarate-Fa) .Marland Kitchen.. 1 Tab By Mouth Daily 2)  Tyzeka 600 Mg Tabs (Telbivudine) .Marland Kitchen.. 1 By Mouth Daily 3)  Omeprazole 20 Mg Cpdr (Omeprazole) .Marland Kitchen.. 1 By Mouth Daily  Allergies (verified): No Known Drug Allergies  Past History:  Past Medical History: Last updated: 07/18/2009 Currently G3P2002 Hepatitis B (2004) ---  Managed by Dr. Santa Genera.  Was on anti-viral therapy that was stoped with pregnancy.  Will plan to restart Telbivudine or Lemivudine at start of 3rd trimester.   Past Surgical History: Last updated: 02/07/2009 None  Family History: Last updated: 02/07/2009 Mother- HTN Father- Healthy  Social History: Last updated: 02/07/2009 Immigrant from [place] in 04/2008. Lives with husband ([name]), son Jordanna Hendrie), and daughter Modell Fendrick). Unemployed No tobacco No EtOH No drugs No regular exercise Best Contact# [phone] [name] Friesenhahn - cousin)  Review of Systems  The patient denies anorexia, fever, and weight  loss.    Physical Exam  General:  Vs noted.  Well gravid woman Lungs:  Normal respiratory effort, chest expands symmetrically. Lungs are clear to auscultation, no crackles or wheezes. Heart:  Normal rate and regular rhythm. S1 and S2 normal without gallop, murmur, click, rub or other extra sounds. Abdomen:  Gravid at 36cm.  Genitalia:  normal introitus, no external lesions, mucosa pink and moist, no vaginal or cervical lesions, no vaginal atrophy, no friaility or hemorrhage, and normal uterus size and position.  White cheezy discharge noted.  Extremities:  Non edemetus BL LE Additional Exam:  [name]: Fetus vertex with Hr 140s. Moveing.    Impression & Recommendations:  Problem # 1:  PREGNANCY, MULTIGRAVIDA (ICD-V22.1) Assessment Unchanged Doing well today at 36 weeks.  GBS, GC/CHL done today.  Will follow up in 1 week. Gave red flags and labor precautions today. Orders: GC/Chlamydia-[facility] ([id number]) Grp B Probe-[facility] ([id number]) Medicaid OB visit - [facility] ([id number])  Problem # 2:  VAGINAL DISCHARGE (ICD-623.5) Assessment: New Not very impressive. Few yeast seen. Not symptomatic. Will offer treatment at the next if she becomes symptomatic by then. [facility]  Problem # 3:  HEPATITIS B CARRIER (ICD-V02.61) Assessment: Unchanged On medication. No side effects. No issues. Plan follow up as above.  Avoid FSE/IUPC in labor and infant gets HepB vaccine and IVIG at delivery.  Liver enzymes WNL last month.   Complete Medication List: 1)  Prenatal/folic Acid Tabs (Prenatal vit-fe fumarate-fa) .Marland Kitchen.. 1 tab by mouth daily 2)  Tyzeka  600 Mg Tabs (Telbivudine) .Marland Kitchen.. 1 by mouth daily 3)  Omeprazole 20 Mg Cpdr (Omeprazole) .Marland Kitchen.. 1 by mouth daily  Patient Instructions: 1)  Thank you for seeing me today. 2)  Please schedule a follow-up appointment in 1 weeks.  3)  If you have vaginal bleeding, suspect your water has broken (large gush of fluid or continuous slow trickle from  vagina) or you have more than 5 contractions per hour for 2 hours straight, please go directly to the MAU. 4)  Every morning and every evening evaluate whether you think the baby is moving enough.  If not, then do kick counts as follows.  Sit in a quiet place and count each movement.  If you get to 5 baby movements in an hour you can stop.  If at 1 hour you have less than 5 movements, keep counting for another hour.  If you don't get 10 movements in these 2 hours, you should go to the MAU or call the clinic. 5)  Keep taking your medicines.   Orders Added: 1)  GC/Chlamydia-[facility] [[id number]] 2)  Grp B Probe-[facility] [[id number]] 3)  Wet Prep- [facility] [[id number]] 4)  Medicaid OB visit - [facility] [[id number]]     Flowsheet View for Follow-up Visit    Estimated weeks of       gestation:     36 0/7    Weight:     138.8    Blood pressure:   113 / 76    Headache:     No    Nausea/vomiting:   No    Edema:     0    Vaginal bleeding:   no    Vaginal discharge:   d/c    Fundal height:      36    FHR:       140    Fetal activity:     yes    Labor symptoms:   no    Fetal position:     vertex    Taking prenatal vits?   Y    Smoking:     n/a    Next visit:     1 wk    Resident:     Marcell Anger Initial Intake Information    Positive HCG by: self    Race: Oriental/Asian    Marital status: Married    Occupation: homemaker    Number of children at home: 2    Hospital of delivery: In [place]    Newborn's physician: [facility]  FOB Information    Husband/Father of baby: [name]    FOB occupation Works  Menstrual History    LMP (date): 04/20/2009    LMP - Character: normal    Menarche: 13 years    Menses interval: 28 days    On BCP's at conception: no  Laboratory Results  Date/Time Received: December 28, 2009 11:38 AM  Date/Time Reported: December 28, 2009 11:48 AM   Wet [place] Source: vag WBC/hpf: 10-20 Bacteria/hpf: 2+  Rods Clue cells/hpf: none  Negative whiff Yeast/hpf: few Trichomonas/hpf:  none Comments: ...............test performed by......Marland KitchenBonnie A. [place], MLS (ASCP)cm

## 2010-03-07 NOTE — Assessment & Plan Note (Signed)
Summary: NOB/DSL   Vital Signs:  Patient profile:   35 year old female LMP:     04/20/2009 Weight:      125.9 pounds BP sitting:   116 / 76  Vitals Entered By: Arlyss Repress CMA, (July 08, 2009 1:53 PM)  Visit Type:  New OB Primary Care Provider:  Marisue Ivan, MD  CC:  New OB.  History of Present Illness: [name] presents to clinic today for her 1st OB visit with me.  She feels well and has no current issues aside from occasional nausea in the morning. She has vomited a few times, but has been able to eat and drink normally. Please see OB flowsheet for further issues.   PHQ-9 = 7= Negative screen  We communicated using a friend as a [employer]arian) Nurse, learning disability.    Habits & Providers  Alcohol-Tobacco-Diet     Alcohol drinks/day: 0     Tobacco Status: never     Cigarette Packs/Day: n/a  Current Problems (verified): 1)  Pregnancy, Multigravida  (ICD-V22.1) 2)  Health Maintenance Exam  (ICD-V70.0) 3)  Overweight  (ICD-278.02) 4)  Hepatitis B Carrier  (ICD-V02.61)  Current Medications (verified): 1)  Prenatal/folic Acid  Tabs (Prenatal Vit-Fe Fumarate-Fa) .Marland Kitchen.. 1 Tab By Mouth Daily  Allergies (verified): No Known Drug Allergies  Past History:  Past Medical History: Currently G3P2002 Hepatitis B (2004) --- not active nor cauing acute hepititis.    Past Surgical History: Reviewed history from 02/07/2009 and no changes required. None  Family History: Reviewed history from 02/07/2009 and no changes required. Mother- HTN Father- Healthy  Social History: Reviewed history from 02/07/2009 and no changes required. Immigrant from [place] in 04/2008. Lives with husband ([name]), son [name]), and daughter [name]). Unemployed No tobacco No EtOH No drugs No regular exercise Best Contact# [phone] [name] [name] - cousin)Hepatitis Risk:  yes Packs/Day:  n/a  Review of Systems       Please see OB flowsheet.  No URQ pain.   Physical  Exam  General:  VS noted.  Well NAD Eyes:  No scleral icterus Mouth:  MMM Lungs:  Normal respiratory effort, chest expands symmetrically. Lungs are clear to auscultation, no crackles or wheezes. Heart:  Normal rate and regular rhythm. S1 and S2 normal without gallop, murmur, click, rub or other extra sounds. Abdomen:  Soft, NT, ND, no HSM, active BS  Genitalia:  Normal introitus for age, no external lesions, no vaginal discharge, mucosa pink and moist, no vaginal or cervical lesions, no vaginal atrophy, no friaility or hemorrhage, normal uterus size and position for dates, no adnexal masses or tenderness Extremities:  Non edemetus   Impression & Recommendations:  Problem # 1:  PREGNANCY, MULTIGRAVIDA (ICD-V22.1) Assessment New Well woman with only Chronic Hep B seropisitivity as an issue. See below.  GC/CL and Pap obtained today.  Plan to RTC in 4 weeks for the [facility].   Orders: GC/Chlamydia-[facility] ([id number]/[id number]) Pap Smear-[facility] ([id number]) Medicaid OB visit - [facility] ([id number])  Problem # 2:  HEPATITIS B CARRIER (ICD-V02.61) Assessment: Unchanged Not active. Discussed with Dr. [name].  Non-active Hep B is not an indication for referal to High Risk OB.  However the infant will require special treatment in delivery.  No FSE or intra-uterine instruments during delivery.   Complete Medication List: 1)  Prenatal/folic Acid Tabs (Prenatal vit-fe fumarate-fa) .Marland Kitchen.. 1 tab by mouth daily  Patient Instructions: 1)  Thank you for seeing me today. 2)  If you have  vaginal bleeding, suspect your water has broken (large gush of fluid or continuous slow trickle from vagina) or you have more than 5 contractions per hour for 1 hours straight, please go directly to the MAU. 3)  If you vomit and cannot eat let me know.  4)  Please schedule a follow-up appointment in 1 month.  5)  Make appointment with [facility] clinic here at [facility]. Prescriptions: PRENATAL/FOLIC ACID  TABS (PRENATAL VIT-FE  FUMARATE-FA) 1 tab by mouth daily  #30 x 12   Entered and Authorized by:   [name]   Signed by:   [name] on 07/08/2009   Method used:   Electronically to        [facility] [phone]* (retail)       [address]       [place], [place]  [address]       Ph: [phone] or [phone]       Fax: [phone]   RxID:   [id number]    DG Initial Intake Information    Positive HCG by: self    Race: Oriental/Asian    Marital status: Married    Occupation: homemaker    Number of children at home: 2    Hospital of delivery: In [place]    Newborn's physician: [facility]  FOB Information    Husband/Father of baby: [name]    FOB occupation Works  Menstrual History    LMP (date): 04/20/2009    EDC by LMP: 01/25/2010    Best Working EDC: 01/25/2010    LMP - Character: normal    LMP - Reliable? : Yes    Menarche: 13 years    Menses interval: 28 days    On BCP's at conception: no    Pre Pregnancy Weight: 120 lbs.    Symptoms since LMP: amenorrhea, nausea, vomiting, fatigue, tender breasts, urinary frequency   Flowsheet View for Follow-up Visit    Estimated weeks of       gestation:     11 2/7    Weight:     125.9    Blood pressure:   116 / 76    Headache:     No    Nausea/vomiting:   vom1/d    Edema:     0    Vaginal bleeding:   no    Vaginal discharge:   no    Labor symptoms:   no    Fetal position:     N/A    Cx Dilation:     0    Cx Effacement:   0%    Cx Station:     high    Taking prenatal vits?   Y    Smoking:     n/a    Next visit:     4 wk    Resident:     [name]    Preceptor:     [name]   Past Pregnancy History    Gravida:     3    Term Births:     2    Living Children:   2    Para:       2  Pregnancy # 1    Delivery date:     01/07/1999    Delivery type:     NSVD    Delivery location:     [place]    Infant Sex:     Female  Pregnancy # 2    Delivery date:     09/22/2000    Delivery type:     NSVD     Delivery location:     [place]   Genetic History     Thalassemia:     mother: no    Neural tube defect:   mother: no    Down's Syndrome:   mother: no    Tay-Sachs:     mother: no    Sickle Cell Dz/Trait:   mother: no    Hemophilia:     mother: no    Muscular Dystrophy:   mother: no    Cystic Fibrosis:   mother: no    Huntington's Dz:   mother: no    Mental Retardation:   mother: no    Fragile X:     mother: no    Other Genetic or       Chromosomal Dz:   mother: no    Child with other       birth defect:     mother: no    > 3 spont. abortions:   mother: no    Hx of stillbirth:     mother: no  Additional Genetic Comments:    Son with 2 seizures no medications and no developmental problems  Infection Risk History    High Risk Hepatitis B: yes    Immunized against Hepatitis B: no    Exposure to TB: no    Patient with history of Genital Herpes: no    Sexual partner with history of Genital Herpes: no    History of STD (GC, Chlamydia, Syphilis, HPV): no    Rash, Viral, or Febrile Illness since LMP: no    Exposure to Cat Litter: no    Chicken Pox Immune Status: Hx of Disease: Immune    History of Parvovirus (Fifth Disease): yes    Occupational Exposure to Children: other  Environmental Exposures    Xray Exposure since LMP: no    Chemical or other exposure: no    Medication, drug, or alcohol use since LMP: no  Additional Infection/Environmental Comments:    Hep B positive   OB Initial Intake Information    Positive HCG by: self    Race: Oriental/Asian    Marital status: Married    Occupation: homemaker    Number of children at home: 2    Hospital of delivery: In [place]    Newborn's physician: [facility]  FOB Information    Husband/Father of baby: [name]    FOB occupation Works  Menstrual History    LMP (date): 04/20/2009    EDC by LMP: 01/25/2010    Best Working EDC: 01/25/2010    LMP - Character: normal    LMP - Reliable? : Yes    Menarche: 13 years     Menses interval: 28 days    On BCP's at conception: no    Pre Pregnancy Weight: 120 lbs.    Symptoms since LMP: amenorrhea, nausea, vomiting, fatigue, tender breasts, urinary frequency

## 2010-03-07 NOTE — Letter (Signed)
Summary: Patient Health Questionnaire   Patient Health Questionnaire   Imported By: Clydell Hakim 07/21/2009 14:15:10  _____________________________________________________________________  External Attachment:    Type:   Image     Comment:   External Document

## 2010-03-07 NOTE — Assessment & Plan Note (Signed)
Summary: 35yo F new patient visit   Vital Signs:  Patient profile:   35 year old female Height:      58.25 inches Weight:      123.1 pounds BMI:     25.60 Temp:     98.1 degrees F oral Pulse rate:   85 / minute BP sitting:   104 / 70  (left arm) Cuff size:   regular  Vitals Entered By: Gladstone Pih (February 07, 2009 9:19 AM) CC: new patient visit Is Patient Diabetic? No Pain Assessment Patient in pain? no        Primary Care Provider:  Marisue Ivan, MD  CC:  new patient visit.  History of Present Illness: 35yo F from [place] here for new patient visit.  Concerns- Nasal drainage.  No other associated symptoms.  Preventative- States that she received a pap [facility] (07/2008 and nl[facility]  Also received vaccinations at that time per patient.  Habits & Providers  Alcohol-Tobacco-Diet     Tobacco Status: never  Allergies (verified): No Known Drug Allergies  Past History:  Past Medical History: G2P2 Hepatitis B (2004)  Past Surgical History: None  Family History: Mother- HTN Father- Healthy  Social History: Immigrant from [place] in 04/2008. Lives with husband ([name]), son Draya Felker), and daughter Buffie Herne). Unemployed No tobacco No EtOH No drugs No regular exercise Best Contact# [phone] [facility] [name] - cousin)Smoking Status:  never  Review of Systems       no vision changes, discoloration of skin,  RUQ abd pain, or steatorrhea  Physical Exam  General:  VS reviewed.  Well appearing, NAD.  Limited English (understands it better than she speaks it). Translator present Head:  normocephalic.   Eyes:  EOMI. PERRLA. Nose:  no active drainage Mouth:  Oral mucosa and oropharynx without lesions or exudates.  Teeth in good repair. Neck:  supple full ROM Lungs:  Normal respiratory effort, chest expands symmetrically. Lungs are clear to auscultation, no crackles or wheezes. Heart:  Normal rate and regular rhythm. S1 and S2 normal  without gallop, murmur, click, rub or other extra sounds. Abdomen:   soft and non-tender without masses, organomegaly or hernias noted. Msk:  Moves all joints. No joint effusions, erythema, or deformities Extremities:  no edema Neurologic:  alert & oriented X3, cranial nerves II-XII intact, strength normal in all extremities, sensation intact to light touch, sensation intact to pinprick, gait normal, and DTRs symmetrical and normal.   Skin:  normal color and turgor Cervical Nodes:  no lad   Impression & Recommendations:  Problem # 1:  HEPATITIS B CARRIER (ICD-V02.61) Assessment Unchanged Asymptomatic.  Will check Hep B surface Ab and Ag.  Will also check liver function.   Orders: Hep Bs Ag-[facility] ([id number]) Hep Bs Ab-[facility] ([id number]) Comp Met-[facility] [phone]) CBC w/Diff-[facility] ([id number])  Problem # 2:  OVERWEIGHT (ICD-278.02) Assessment: Unchanged BMI 25.  Will check direct LDL and screen random glucose.   Orders: Direct LDL-[facility] ([id number])  Problem # 3:  Preventive Health Care (ICD-V70.0) Assessment: Comment Only Patient states she is up to date on flu, tetanus, and pap smear.  Misty Stanley has requested documentation from the [facility].  Complete Medication List: 1)  Zyrtec Allergy 10 Mg Caps (Cetirizine hcl) .Marland Kitchen.. 1 tablet by mouth daily as needed for allergic rhinitis  Patient Instructions: 1)  Your follow up will be dependent upon your lab results.  I will call you if there is any abnormalities otherwise I will send  you a letter of your results and when to follow up for your next appointment. 2)  Thank you for allowing [name] to care for you. Prescriptions: ZYRTEC ALLERGY 10 MG CAPS (CETIRIZINE HCL) 1 tablet by mouth daily as needed for allergic rhinitis  #30 x 5   Entered and Authorized by:   Marisue Ivan  MD   Signed by:   Marisue Ivan  MD on 02/07/2009   Method used:   Print then Give to Patient   RxID:   [id number]    Prevention & Chronic  Care Immunizations   Influenza vaccine: Not documented   Influenza vaccine deferral: Not indicated  (02/07/2009)    Tetanus booster: Not documented   Td booster deferral: Not indicated  (02/07/2009)    Pneumococcal vaccine: Not documented    Immunization comments: Tetanus was provided 12/09 per patient  Other Screening   Pap smear: Not documented   Smoking status: never  (02/07/2009)  Appended Document: 35yo F new patient visit    Clinical Lists Changes  Observations: Added new observation of PAP DUE: 08/04/2008 (02/08/2009 12:26) Added new observation of CREATNXTDUE: 02/07/2010 (02/08/2009 12:26) Added new observation of POTASSIUMDUE: 02/07/2010 (02/08/2009 12:26) Added new observation of LAST PAP DAT: 08/05/2007 (08/05/2007 12:27) Added new observation of PAP SMEAR: normal (08/05/2007 12:27)        PAP Result Date:  08/05/2007 PAP Result:  normal PAP Next Due:  1 yr

## 2010-03-07 NOTE — Miscellaneous (Signed)
Summary: call from [facility],  Doreene Adas RN  Clinical Lists Changes   received call form Doreene Adas from [facility] on physician  / pharmacy voicemail line  wanting to speak with Dr. Burnadette Pop about patient. RN called her back and left message  and number to call back to my direct line. . if urgent matter will page Dr. Burnadette Pop. Kathie Rhodes 's phone number  [phone], cell [phone] Loyola Santino RN  March 31, 2009 12:17 PM  spoke with Doreene Adas . she thinks MD is aware that patient is Hep B positive. She thinks she has been set up for ultrasound and biopsy.  Patient complains of fatigue and RUQ pain.  Kathie Rhodes  states that Hep B testing were done on patient's children at birth but apparently there was insufficent blood for testing. She has done Hep B testing on children, An Dre Peeters and Gwenyth Bender and both are Hep B positive. Husband Krec Mlo testing showed that he has had the disese but the virus has cleared. Also patient reported that her sister in [place] has Hep B .  Kathie Rhodes wants MD aware of all this and recommends children be followed for this,  Hepatic panel, possible referral to specialist. They are currently asymtomatic. will send message to MD . Betty's phone number is in above note if needed. She will fax over lab results.  Lilliemae Fruge RN  March 31, 2009 1:47 PM

## 2010-03-07 NOTE — Letter (Signed)
Summary: Handout Printed  Printed Handout:  - Prenatal-Flowsheet-[facility] 

## 2010-03-07 NOTE — Consult Note (Signed)
Summary: [facility]   Imported By: Knox Royalty 10/27/2009 10:01:25  _____________________________________________________________________  External Attachment:    Type:   Image     Comment:   External Document

## 2010-03-09 NOTE — Consult Note (Signed)
Summary: [facility]   Imported By: De Nurse 02/15/2010 15:36:51  _____________________________________________________________________  External Attachment:    Type:   Image     Comment:   External Document

## 2010-03-09 NOTE — Assessment & Plan Note (Signed)
Summary: OB F/U/RH   Vital Signs:  Patient profile:   35 year old female Height:      58.25 inches Weight:      140.7 pounds Pulse rate:   89 / minute BP sitting:   100 / 69  (left arm) Cuff size:   regular  Vitals Entered By: Tessie Fass CMA (January 24, 2010 1:44 PM) CC: OB Visit Pain Assessment Patient in pain? no        Primary Care Provider:  [name]  CC:  OB Visit.  History of Present Illness: Well at 39.6 weeks. No issues. No CTX, bleeding.  See flowsheet.  Well.   Habits & Providers  Alcohol-Tobacco-Diet     Alcohol drinks/day: 0     Tobacco Status: never     Cigarette Packs/Day: n/a  Current Problems (verified): 1)  Esophageal Reflux  (ICD-530.81) 2)  Pregnancy, Multigravida  (ICD-V22.1) 3)  Health Maintenance Exam  (ICD-V70.0) 4)  Overweight  (ICD-278.02) 5)  Hepatitis B Carrier  (ICD-V02.61)  Current Medications (verified): 1)  Prenatal/folic Acid  Tabs (Prenatal Vit-Fe Fumarate-Fa) .Marland Kitchen.. 1 Tab By Mouth Daily 2)  Tyzeka 600 Mg Tabs (Telbivudine) .Marland Kitchen.. 1 By Mouth Daily 3)  Omeprazole 20 Mg Cpdr (Omeprazole) .Marland Kitchen.. 1 By Mouth Daily  Allergies (verified): No Known Drug Allergies  Past History:  Past Medical History: Last updated: 07/18/2009 Currently G3P2002 Hepatitis B (2004) ---  Managed by Dr. Santa Genera.  Was on anti-viral therapy that was stoped with pregnancy.  Will plan to restart Telbivudine or Lemivudine at start of 3rd trimester.   Past Surgical History: Last updated: 02/07/2009 None  Family History: Last updated: 02/07/2009 Mother- HTN Father- Healthy  Social History: Last updated: 02/07/2009 Immigrant from [place] in 04/2008. Lives with husband ([name]), son Marlicia Sroka), and daughter Tenna Lacko). Unemployed No tobacco No EtOH No drugs No regular exercise Best Contact# [phone] [name] Galdamez - cousin)  Risk Factors: Smoking Status: never (01/24/2010) Packs/Day: n/a (01/24/2010)  Review of Systems  The patient  denies anorexia, fever, and weight loss.    Physical Exam  General:  Vs noted.  Well NAD Abdomen:  38 cm. Vertex Genitalia:  normal introitus, no external lesions, mucosa pink and moist, no vaginal or cervical lesions, no vaginal atrophy, no friaility or hemorrhage, and normal uterus size and position.   Cervix is fingertip and closed thick. Extremities:  Non edemetus BL LE   Impression & Recommendations:  Problem # 1:  PREGNANCY, MULTIGRAVIDA (ICD-V22.1) Assessment Unchanged  Doing well. Will likely go post dates with her current cervix and no sign of labor.  Plan to schedule a NST and BPP Wed 30th at [facility].  Induction scheduled for 30th at 630am.  Have emailed  Elsie Lincoln. May consider delaying the induction date until Jan 2nd if OK with OB to try to avoid a difficult induction if possible.  Will follow up early next week.   Orders: Medicaid OB visit - [facility] (09811)  Complete Medication List: 1)  Prenatal/folic Acid Tabs (Prenatal vit-fe fumarate-fa) .Marland Kitchen.. 1 tab by mouth daily 2)  Tyzeka 600 Mg Tabs (Telbivudine) .Marland Kitchen.. 1 by mouth daily 3)  Omeprazole 20 Mg Cpdr (Omeprazole) .Marland Kitchen.. 1 by mouth daily  Patient Instructions: 1)  Thank you for seeing me today. 2)  Wednesday 28th at 830 am go to [facility] for testing.  3)  Friday 30th at 630 am go to [facility] for induction.  4)  Make appointment with  Family Practice clinic early next week.     Orders Added: 1)  Medicaid OB visit - [facility] [[id number]]     OB Initial Intake Information    Positive HCG by: self    Race: Oriental/Asian    Marital status: Married    Occupation: homemaker    Number of children at home: 2    Hospital of delivery: In [place]    Newborn's physician: [facility]  FOB Information    Husband/Father of baby: [name]    FOB occupation Works  Menstrual History    LMP (date): 04/20/2009    LMP - Character: normal    Menarche: 13 years    Menses interval: 28  days    On BCP's at conception: no   Flowsheet View for Follow-up Visit    Estimated weeks of       gestation:     39 6/7    Weight:     140.7    Blood pressure:   100 / 69    Headache:     No    Nausea/vomiting:   No    Edema:     0    Vaginal bleeding:   no    Vaginal discharge:   d/c    Fundal height:      38    FHR:       140    Fetal activity:     yes    Labor symptoms:   few ctx    Fetal position:     vertex    Cx Dilation:     FT    Cx Effacement:   0%    Cx Station:     high    Taking prenatal vits?   Y    Smoking:     n/a    Next visit:     1 wk    Resident:     [name]    Preceptor:     Leveda Anna

## 2010-03-09 NOTE — Letter (Signed)
Summary: *Referral Letter  [facility]  [address]   [place], [place] [address]   Phone: [phone]  Fax: [phone]    02/02/2010  Thank you in advance for agreeing to see my patient:  Khrystyne Arpin [address] [place], [place]  [address]  Phone: [phone]  Reason for Referral: Continuation of Telbivudine? Ms Schlosser recently had her baby. She wishes to breastfeed. The data of breastfeeding on this medication is equivocal but leaning to unsafe.  Do you want Ms Rahn to continue this or an other medication and what is the expected duration of treatment?    Current Medications: 1)  PRENATAL/FOLIC ACID  TABS (PRENATAL VIT-FE FUMARATE-FA) 1 tab by mouth daily 2)  TYZEKA 600 MG TABS (TELBIVUDINE) 1 by mouth daily 3)  OMEPRAZOLE 20 MG CPDR (OMEPRAZOLE) 1 by mouth daily   Past Medical History: 1)  Currently [id number] 2)  Hepatitis B (2004) ---  Managed by Dr. Santa Genera.  Was on anti-viral therapy that was stoped with pregnancy.  Will plan to restart Telbivudine or Lemivudine at start of 3rd trimester.     Thank you again for agreeing to see our patient; please contact us if you have any further questions or need additional information.  Sincerely,  [name]

## 2010-03-09 NOTE — Assessment & Plan Note (Signed)
Summary: F/U/RH   Vital Signs:  Patient profile:   35 year old female Height:      58.25 inches Weight:      143 pounds BMI:     29.74 Pulse rate:   98 / minute BP sitting:   101 / 69  (right arm) Cuff size:   regular  Vitals Entered By: Tessie Fass CMA (January 17, 2010 8:46 AM) CC: ob visit Pain Assessment Patient in pain? no        Primary Care Provider:  [name]  CC:  ob visit.  History of Present Illness: 38.6 weeks. Doing well. Irrgular non pauinful contractions lasting 1 min. Not like "real" labor. Well. Taking meds as directed.   Habits & Providers  Alcohol-Tobacco-Diet     Alcohol drinks/day: 0     Tobacco Status: never     Cigarette Packs/Day: n/a  Current Problems (verified): 1)  Esophageal Reflux  (ICD-530.81) 2)  Pregnancy, Multigravida  (ICD-V22.1) 3)  Health Maintenance Exam  (ICD-V70.0) 4)  Overweight  (ICD-278.02) 5)  Hepatitis B Carrier  (ICD-V02.61)  Current Medications (verified): 1)  Prenatal/folic Acid  Tabs (Prenatal Vit-Fe Fumarate-Fa) .Marland Kitchen.. 1 Tab By Mouth Daily 2)  Tyzeka 600 Mg Tabs (Telbivudine) .Marland Kitchen.. 1 By Mouth Daily 3)  Omeprazole 20 Mg Cpdr (Omeprazole) .Marland Kitchen.. 1 By Mouth Daily  Allergies (verified): No Known Drug Allergies  Past History:  Past Medical History: Last updated: 07/18/2009 Currently G3P2002 Hepatitis B (2004) ---  Managed by Dr. Santa Genera.  Was on anti-viral therapy that was stoped with pregnancy.  Will plan to restart Telbivudine or Lemivudine at start of 3rd trimester.   Past Surgical History: Last updated: 02/07/2009 None  Social History: Last updated: 02/07/2009 Immigrant from [place] in 04/2008. Lives with husband ([name]), son Chareese Sergent), and daughter Crissie Aloi). Unemployed No tobacco No EtOH No drugs No regular exercise Best Contact# [phone] [name] Dioguardi - cousin)  Risk Factors: Smoking Status: never (01/17/2010) Packs/Day: n/a (01/17/2010)  Review of Systems  The patient denies  anorexia, fever, and weight loss.    Physical Exam  General:  VS noted.  Well NAD Abdomen:  37 cm. Vertex Extremities:  Non edemetus BL LE   Impression & Recommendations:  Problem # 1:  PREGNANCY, MULTIGRAVIDA (ICD-V22.1) Assessment Unchanged  Doing well at 38.6 weeks.  Expectant management.  Will follow up in 1 week. Will schedule induction at next visit to get an agreeable time for both Ms Locey and my self.   Orders: Medicaid OB visit - [facility] ([id number])  Problem # 2:  HEPATITIS B CARRIER (ICD-V02.61) On medication. No side effects. No issues. Plan follow up as above.  Avoid FSE/IUPC in labor and infant gets HepB vaccine and IVIG at delivery.  Liver enzymes WNL   Problem # 3:  ESOPHAGEAL REFLUX (ICD-530.81) Assessment: Unchanged On omperazole and doing well.  Her updated medication list for this problem includes:    Omeprazole 20 Mg Cpdr (Omeprazole) .Marland Kitchen... 1 by mouth daily  Complete Medication List: 1)  Prenatal/folic Acid Tabs (Prenatal vit-fe fumarate-fa) .Marland Kitchen.. 1 tab by mouth daily 2)  Tyzeka 600 Mg Tabs (Telbivudine) .Marland Kitchen.. 1 by mouth daily 3)  Omeprazole 20 Mg Cpdr (Omeprazole) .Marland Kitchen.. 1 by mouth daily  Patient Instructions: 1)  Thank you for seeing me today. 2)  If you have vaginal bleeding, suspect your water has broken (large gush of fluid or continuous slow trickle from vagina) or you have more than 5 contractions per hour for 2 hours  straight, please go directly to the MAU. 3)  Every morning and every evening evaluate whether you think the baby is moving enough.  If not, then do kick counts as follows.  Sit in a quiet place and count each movement.  If you get to 5 baby movements in an hour you can stop.  If at 1 hour you have less than 5 movements, keep counting for another hour.  If you don't get 10 movements in these 2 hours, you should go to the MAU or call the clinic. 4)  Please schedule a follow-up appointment in 1 weeks.    Orders Added: 1)  Medicaid OB visit - [facility]  [[id number]]     Flowsheet View for Follow-up Visit    Estimated weeks of       gestation:     38 6/7    Weight:     143    Blood pressure:   101 / 69    Headache:     No    Nausea/vomiting:   No    Edema:     0    Vaginal bleeding:   no    Vaginal discharge:   d/c    Fundal height:      37    FHR:       135    Fetal activity:     yes    Labor symptoms:   few ctx    Fetal position:     vertex    Taking prenatal vits?   Y    Smoking:     n/a    Next visit:     1 wk    Resident:     [name]

## 2010-03-10 NOTE — Consult Note (Signed)
Summary: Refugee Health Assessment  Refugee Health Assessment   Imported By: De Nurse 02/15/2009 15:27:36  _____________________________________________________________________  External Attachment:    Type:   Image     Comment:   External Document  Appended Document: Refugee Health Assessment Reviewed

## 2010-03-17 ENCOUNTER — Encounter: Payer: Self-pay | Admitting: Family Medicine

## 2010-03-17 ENCOUNTER — Ambulatory Visit (INDEPENDENT_AMBULATORY_CARE_PROVIDER_SITE_OTHER): Payer: Medicaid Other | Admitting: Family Medicine

## 2010-03-17 VITALS — BP 102/70 | HR 92 | Wt 120.9 lb

## 2010-03-17 DIAGNOSIS — Z30430 Encounter for insertion of intrauterine contraceptive device: Secondary | ICD-10-CM

## 2010-03-17 DIAGNOSIS — B181 Chronic viral hepatitis B without delta-agent: Secondary | ICD-10-CM

## 2010-03-17 DIAGNOSIS — Z3480 Encounter for supervision of other normal pregnancy, unspecified trimester: Secondary | ICD-10-CM | POA: Insufficient documentation

## 2010-03-17 DIAGNOSIS — Z975 Presence of (intrauterine) contraceptive device: Secondary | ICD-10-CM

## 2010-03-17 MED ORDER — LEVONORGESTREL 20 MCG/24HR IU IUD
INTRAUTERINE_SYSTEM | Freq: Once | INTRAUTERINE | Status: AC
  Administered 2010-03-17: 12:00:00 via INTRAUTERINE

## 2010-03-17 NOTE — Assessment & Plan Note (Signed)
Mirena placed today w/out complication.  Red flags reviewed. Handout printed.  Will f/u in 4 weeks to recheck strings.

## 2010-03-17 NOTE — Assessment & Plan Note (Signed)
Will check CMP and viral load in 4 weeks. Per Dr. Lottie Mussel.

## 2010-03-17 NOTE — Progress Notes (Signed)
Addended by: Jone Baseman on: 03/17/2010 11:46 AM   Modules accepted: Orders

## 2010-03-17 NOTE — Progress Notes (Signed)
Post Partum: Well today. Normal bleeding. No pain. Feels well. Normal D/C. Happy and healthy. Would like IUD today. Breastfeeding.   IUD: Planned on IUD insertion before delivery.  Would like mirena if possibly. Discussed risk vs benefits already. Not smoking.   Exam: VS noted.  Gen: Well NAD Lungs: CTABL Heart: RRR soft 2/6 non radiating murmur noted. Pulses normal and equal BL.  Abd: NABS, NT, ND, soft. Fundus below pelvic brim.  Genitals: Normal appearing external genitalia. Cervix is forward and normal appearing and normal to palpation. Uterus appears to be retroflexed on bimanual exam. Adnexa normal. No discharge or bleeding noted.  Ext: Non edematous BL LE   IUD insert note: Consent obtained and time out performed. Cervix position was checked with a bimanual exam and found to be posterior.  A speculum was placed and the cervix was visualized. The cervix was cleaned with betadine. A tenaculum was attached to the cervix on the anterior lip.  A sounding probed was easily placed and reached 7cm before resistance was met. Then the Mirena device was prepared with the flange set to 7cm.  The device was inserted and the IUD was released without difficulty. The strings were cut to 2-3 cm. The patient tolerated the procedure well without complications or more than mild pain and cramping.

## 2010-03-17 NOTE — Assessment & Plan Note (Signed)
6 weeks post partum today. Well and happy. Exam is normal. Breast feeding is going well.  Plan: IUD as noted. F/u in 4 weeks.  Encourage breast feeding.  Handout printed.

## 2010-03-17 NOTE — Patient Instructions (Signed)
Thank you for coming today.  Come back in 1 month for labs and to check your IUD.  Let me know about pain with sex or heavy vaginal bleeding.  It is OK to have sex after tomorrow.  Congratulations!

## 2010-04-17 LAB — CBC
HCT: 30 % — ABNORMAL LOW (ref 36.0–46.0)
HCT: 37.1 % (ref 36.0–46.0)
MCH: 23.2 pg — ABNORMAL LOW (ref 26.0–34.0)
MCHC: 32.9 g/dL (ref 30.0–36.0)
MCV: 70.5 fL — ABNORMAL LOW (ref 78.0–100.0)
Platelets: 234 10*3/uL (ref 150–400)
Platelets: 258 10*3/uL (ref 150–400)
RDW: 14.8 % (ref 11.5–15.5)
RDW: 15.1 % (ref 11.5–15.5)
WBC: 13.1 10*3/uL — ABNORMAL HIGH (ref 4.0–10.5)

## 2010-04-20 LAB — GLUCOSE, CAPILLARY: Glucose-Capillary: 181 mg/dL — ABNORMAL HIGH (ref 70–99)

## 2010-05-01 LAB — CBC
Hemoglobin: 13.2 g/dL (ref 12.0–15.0)
RBC: 5.57 MIL/uL — ABNORMAL HIGH (ref 3.87–5.11)
WBC: 12.6 10*3/uL — ABNORMAL HIGH (ref 4.0–10.5)

## 2010-05-01 LAB — APTT: aPTT: 38 seconds — ABNORMAL HIGH (ref 24–37)

## 2010-05-01 LAB — PROTIME-INR
INR: 1.01 (ref 0.00–1.49)
Prothrombin Time: 13.2 seconds (ref 11.6–15.2)

## 2010-05-03 ENCOUNTER — Ambulatory Visit (INDEPENDENT_AMBULATORY_CARE_PROVIDER_SITE_OTHER): Payer: Self-pay | Admitting: Family Medicine

## 2010-05-03 ENCOUNTER — Encounter: Payer: Self-pay | Admitting: Family Medicine

## 2010-05-03 VITALS — BP 110/78 | Temp 98.1°F | Ht 58.25 in | Wt 122.0 lb

## 2010-05-03 DIAGNOSIS — Z30431 Encounter for routine checking of intrauterine contraceptive device: Secondary | ICD-10-CM | POA: Insufficient documentation

## 2010-05-03 DIAGNOSIS — B181 Chronic viral hepatitis B without delta-agent: Secondary | ICD-10-CM

## 2010-05-03 DIAGNOSIS — IMO0001 Reserved for inherently not codable concepts without codable children: Secondary | ICD-10-CM | POA: Insufficient documentation

## 2010-05-03 NOTE — Assessment & Plan Note (Addendum)
Doing OK. Will check CMP and viral load today. Will send to Dr. Lottie Mussel. May want to restart anti virals per GI.  Will follow.

## 2010-05-03 NOTE — Progress Notes (Signed)
Brenda Brady presents to clinic today to follow up her IUD and her Hepatitis B.   1) IUD: Feels OK. Does note some pelvic cramps in the last 2 weeks. The cramps last around 2 mins and occur a few times a day. Not terribly bothersome. She additionally notes a small amount of bleeding with the cramps. No full menstrual cycle yet. She has not checked for the IUD strings. Nor has she notes that it fell out.   2) Hep B: As per prior arrangements Brenda Brady is due for liver enzymes and a viral load. She is off Telbivudine so that she can breast feed her child. She would like to consider re-starting her medication.  She denies any RUQ abdominal pain. Is well.   3) Breast Feeding: Currently both bottle and breast feeding. She is thinking about weaning as above. She would like some advice on how to reduce breast pain with weaning.   ROS: No fevers or chills. Well as above.   Exam:  Vs noted.  Gen: Well NAD HEENT: EOMI, PERRL, MMM, no scleral icterus Lungs: CTABL Nl WOB Heart: RRR no MRG Abd: NABS, NT, ND, no hepatomegaly.  Exts: Non edematous BL  LE Genitals: External genitals are normal appearing. Cervix is normal. 2 black IUD strings are visible at cervical os.  Non tender. Uterus is below pelvic brim.

## 2010-05-03 NOTE — Assessment & Plan Note (Signed)
Advised to gradually wean and to take Ibuprofen PRN. Also advised a well fitting and supportive bra.

## 2010-05-03 NOTE — Assessment & Plan Note (Signed)
Strings present. Think pelvic cramps and small bleeding is due to menstrual cycles. Advised about irregular bleeding. Warned of red flags. Pt expresses understanding.

## 2010-05-04 LAB — COMPREHENSIVE METABOLIC PANEL
ALT: 46 U/L — ABNORMAL HIGH (ref 0–35)
AST: 25 U/L (ref 0–37)
Albumin: 4.3 g/dL (ref 3.5–5.2)
Alkaline Phosphatase: 103 U/L (ref 39–117)
BUN: 11 mg/dL (ref 6–23)
CO2: 24 mEq/L (ref 19–32)
Calcium: 9.5 mg/dL (ref 8.4–10.5)
Chloride: 102 mEq/L (ref 96–112)
Creat: 0.72 mg/dL (ref 0.40–1.20)
Glucose, Bld: 80 mg/dL (ref 70–99)
Potassium: 3.9 mEq/L (ref 3.5–5.3)
Sodium: 138 mEq/L (ref 135–145)
Total Bilirubin: 0.5 mg/dL (ref 0.3–1.2)
Total Protein: 8.1 g/dL (ref 6.0–8.3)

## 2010-05-18 ENCOUNTER — Encounter: Payer: Self-pay | Admitting: Family Medicine

## 2010-05-18 ENCOUNTER — Ambulatory Visit (INDEPENDENT_AMBULATORY_CARE_PROVIDER_SITE_OTHER): Payer: Self-pay | Admitting: Family Medicine

## 2010-05-18 DIAGNOSIS — B181 Chronic viral hepatitis B without delta-agent: Secondary | ICD-10-CM

## 2010-05-18 DIAGNOSIS — N926 Irregular menstruation, unspecified: Secondary | ICD-10-CM

## 2010-05-18 NOTE — Assessment & Plan Note (Signed)
I think this is due to the IUD reducing estrogen levels resulting in loss of the endometrium. This is a very normal side effect. Will follow along. If worsening or not getting better by 1 year we will re-address.

## 2010-05-18 NOTE — Progress Notes (Signed)
Follow up liver tests: Comes with translator today. Has appointment with Dr. Kinnie Scales on April 24th. Feeling well. Not on any medications currently. No abdominal pain or jaundice.   Notes some vaginal bleeding following sex. No pain with sex. Is worried about the IUD. The device has not fallen out. Feels well. Voiding and stooling normally.   PMH reviewed.   ROS: See HPI otherwise normal.  Exam:  Vs noted.  Gen: Well NAD Lungs: CTABL Nl WOB Heart: RRR no MRG Abd: NABS, NT, ND

## 2010-05-18 NOTE — Assessment & Plan Note (Signed)
Doing OK. No meds currently. Labs worsened off medications. I sent a letter to her GI doc. She has an appointment  On April 24th. Will follow up results. Not breast feeding. I expect that she will go back on the medication.

## 2010-09-19 ENCOUNTER — Ambulatory Visit (INDEPENDENT_AMBULATORY_CARE_PROVIDER_SITE_OTHER): Payer: Self-pay | Admitting: Family Medicine

## 2010-09-19 VITALS — BP 117/82 | HR 98 | Temp 98.2°F | Wt 129.8 lb

## 2010-09-19 DIAGNOSIS — B181 Chronic viral hepatitis B without delta-agent: Secondary | ICD-10-CM

## 2010-09-20 ENCOUNTER — Encounter: Payer: Self-pay | Admitting: Family Medicine

## 2010-09-20 NOTE — Progress Notes (Signed)
Presents to clinic today to follow up her Hepatitis B. She was managed by Dr. Lorelee Market while she was pregnant and had pregnancy medicaid. She currently does not have any insurance aside from the community access program here in [place]. Thus she can no longer get care with Dr. Lorelee Market. She was controlled well with Telbivudine however as she does not have insurance she cannot afford this medication. She is doing well otherwise. No abdominal pain or jaundice.   PMH reviewed.  ROS as above otherwise neg  Exam:  BP 117/82  Pulse 98  Temp(Src) 98.2 F (36.8 C) (Oral)  Wt 129 lb 12.8 oz (58.877 kg) Gen: Well NAD Heart: RRR no MRG Abd: NABS, NT, ND

## 2010-09-20 NOTE — Assessment & Plan Note (Signed)
Ms Sanseverino did well on Telbivudine and she has increased LFTs off the medication. She is totally unable to afford this medication out of pocket.  Plan: Filled out [employer] patient assistance program information and patient to take home her side of the form. Will apply and hopefully she will be able to get this medication.  Will follow up in 2 months.

## 2011-01-26 ENCOUNTER — Telehealth: Payer: Self-pay | Admitting: Clinical

## 2011-01-26 ENCOUNTER — Ambulatory Visit (INDEPENDENT_AMBULATORY_CARE_PROVIDER_SITE_OTHER): Payer: Self-pay | Admitting: Family Medicine

## 2011-01-26 VITALS — BP 120/84 | HR 86 | Temp 98.0°F | Ht 58.25 in | Wt 129.0 lb

## 2011-01-26 DIAGNOSIS — B181 Chronic viral hepatitis B without delta-agent: Secondary | ICD-10-CM

## 2011-01-26 LAB — CBC
HCT: 39.7 % (ref 36.0–46.0)
MCHC: 31.5 g/dL (ref 30.0–36.0)
Platelets: 335 10*3/uL (ref 150–400)
RDW: 15.2 % (ref 11.5–15.5)
WBC: 8.6 10*3/uL (ref 4.0–10.5)

## 2011-01-26 NOTE — Progress Notes (Signed)
Brenda Brady is a pleasant 35 year old woman from [place] with active hepatitis B. She was previously managed by doctor Madoff with telbivudine.  This resulted in a lowered viral count and improved liver function tests. However her pregnancy Medicaid expired and she was unable to pay for this medicine or see her hepatologist.  Recently I have been prescribing the same medicine and following. She gets the medicine through the charity deprogram of the manufacturer.  She continues to take it and feels well with no symptoms.  She is here today for routine followup of active hepatitis B.  PMH reviewed.  ROS as above otherwise neg Medications reviewed. Current Outpatient Prescriptions  Medication Sig Dispense Refill  . telbivudine (TYZEKA) 600 MG tablet Take 600 mg by mouth daily.          Exam:  BP 120/84  Pulse 86  Temp(Src) 98 F (36.7 C) (Oral)  Ht 4' 10.25" (1.48 m)  Wt 129 lb (58.514 kg)  BMI 26.73 kg/m2 Gen: Well NAD Lungs: CTABL Nl WOB Heart: RRR no MRG Abd: NABS, NT, ND Exts: Non edematous BL  LE, warm and well perfused.

## 2011-01-26 NOTE — Telephone Encounter (Signed)
Clinical Child psychotherapist (CSW) received referral to assist pt in figuring out why they have not received their children's Medicaid card. CSW contacted DSS and was informed that pt should receive Medicaid cards in the mail soon. CSW contacted pt and informed her of this information and pt confirmed she understood.  Theresia Bough, MSW, Theresia Majors [phone]

## 2011-01-26 NOTE — Assessment & Plan Note (Signed)
Doing well on Telbivudine.  Refilled paperwork from [employer], educational.  Plan to test CBC, CMP, hepatitis B. viral load. Followup in 3 months.  She will come back in one month with her son for a well-child check with a translator.

## 2011-01-27 LAB — COMPLETE METABOLIC PANEL WITH GFR
Albumin: 4.2 g/dL (ref 3.5–5.2)
Alkaline Phosphatase: 61 U/L (ref 39–117)
BUN: 9 mg/dL (ref 6–23)
CO2: 22 mEq/L (ref 19–32)
GFR, Est African American: >89 mL/min
GFR, Est Non African American: >89 mL/min
Glucose, Bld: 80 mg/dL (ref 70–99)
Potassium: 3.8 mEq/L (ref 3.5–5.3)
Total Protein: 8.3 g/dL (ref 6.0–8.3)

## 2011-01-29 IMAGING — US US ABDOMEN COMPLETE
1 series · 14 of 25 positions shown · non-contrast
Comparison: None

CLINICAL DATA: Hepatitis B.  Rule out hematoma.

COMPLETE ABDOMINAL ULTRASOUND

[Series 1: us abdomen complete · 0.20mm/px · 14 of 65 slices shown]
[im 1/65]
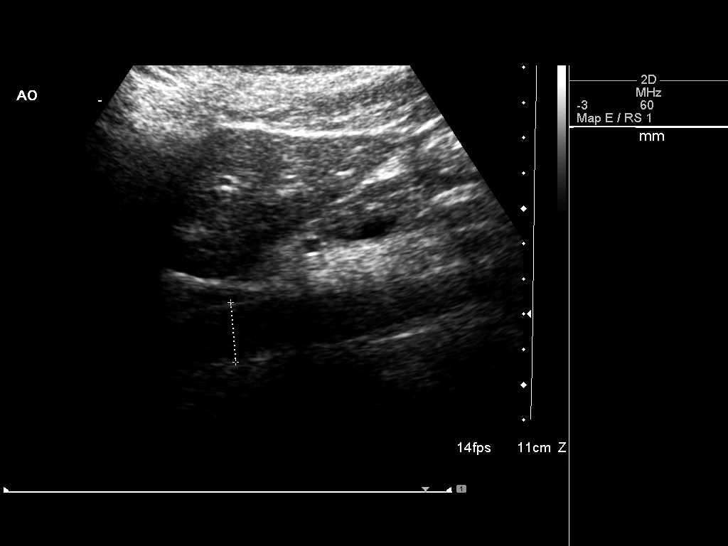
[im 6/65]
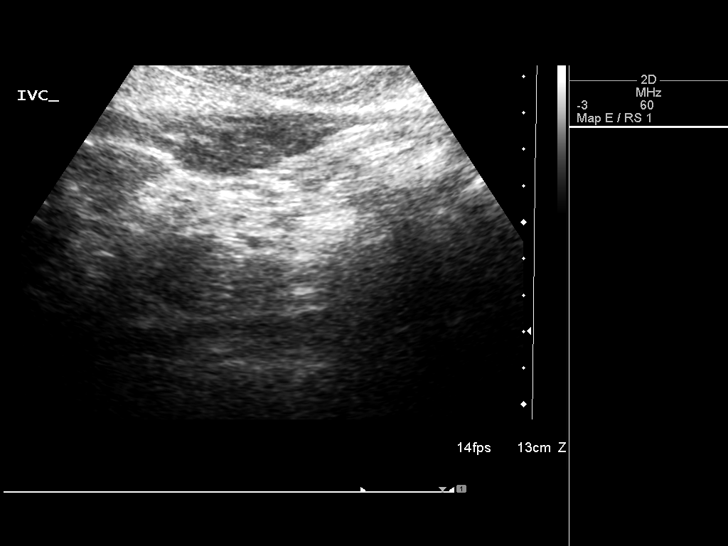
[im 11/65]
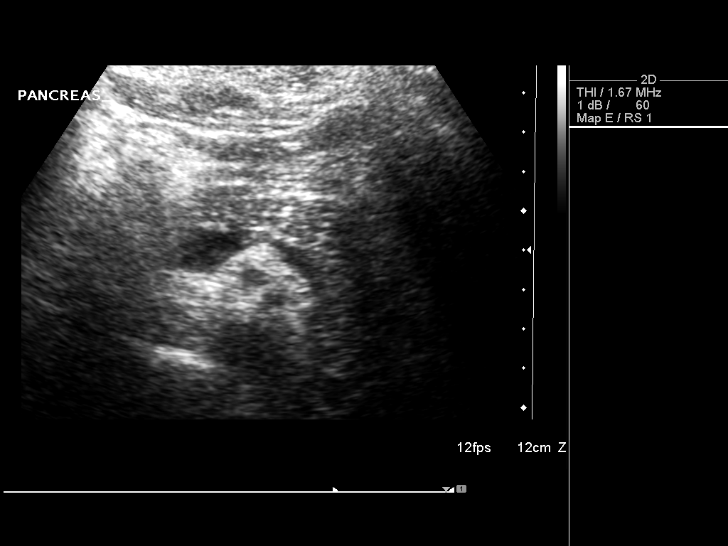
[im 17/65]
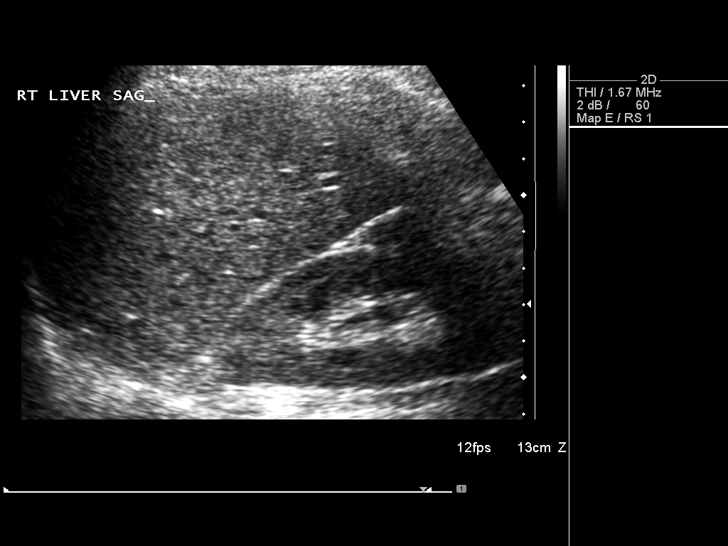
[im 22/65]
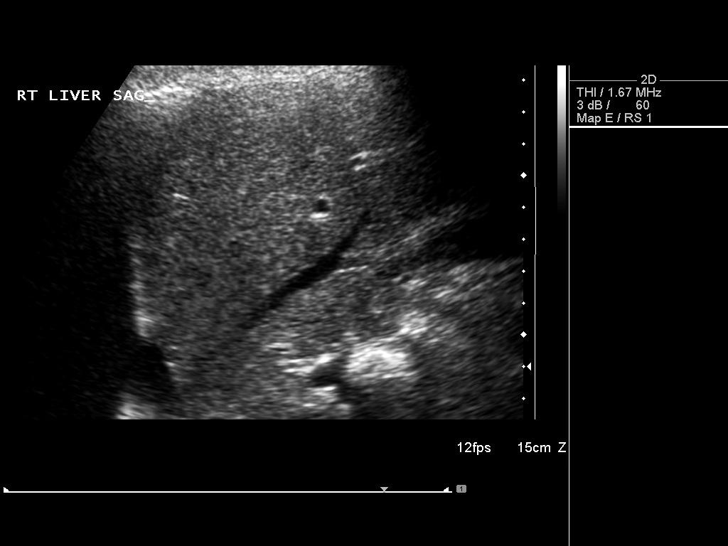
[im 25/65]
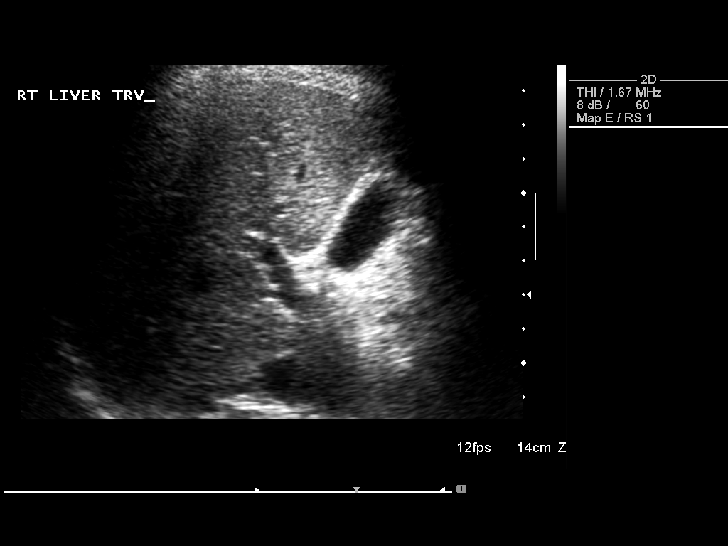
[im 30/65]
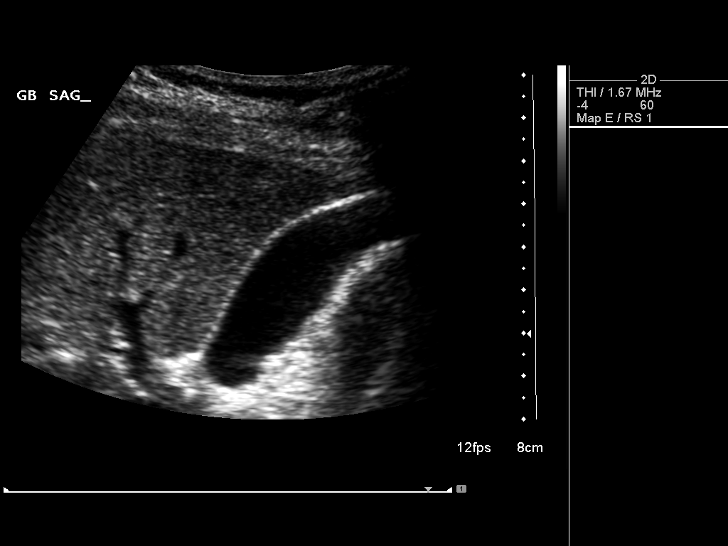
[im 35/65]
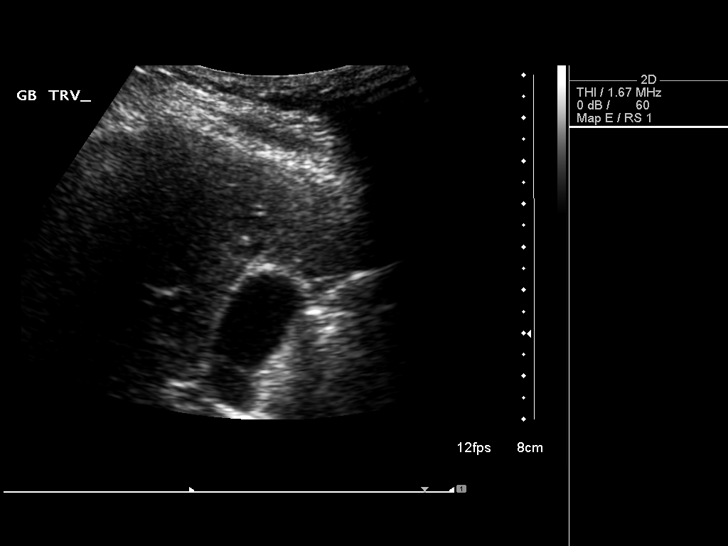
[im 41/65]
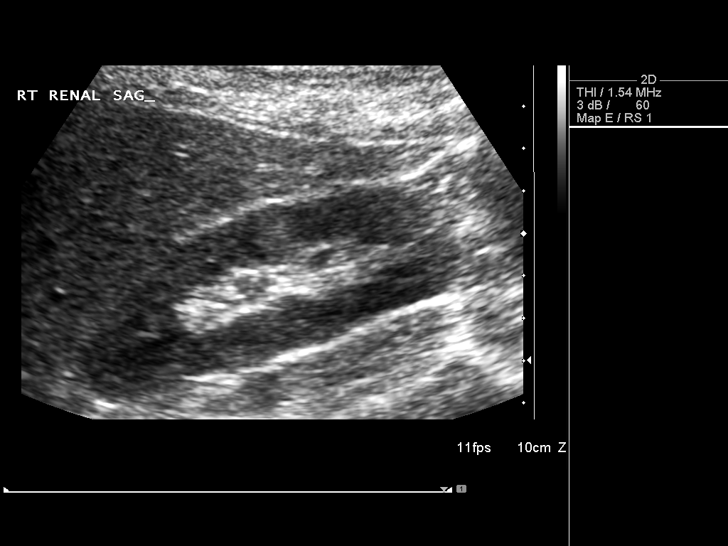
[im 43/65]
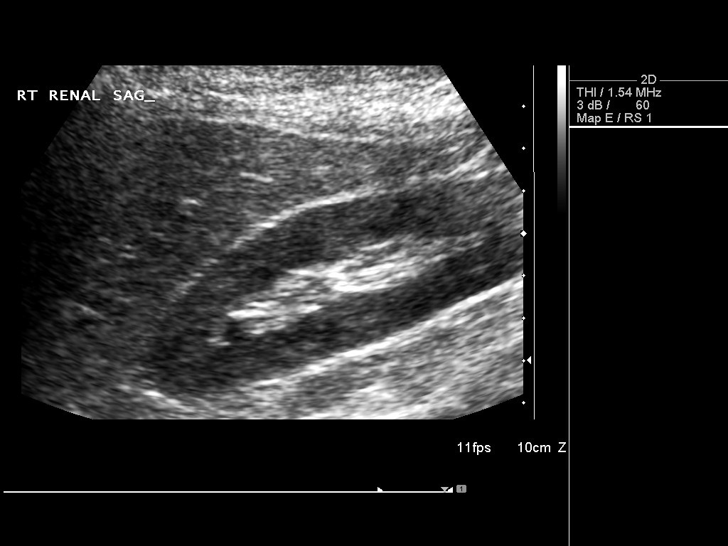
[im 49/65]
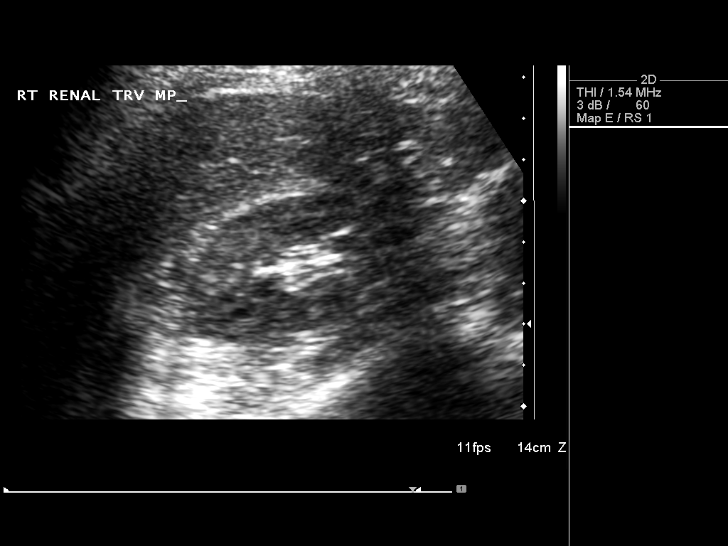
[im 54/65]
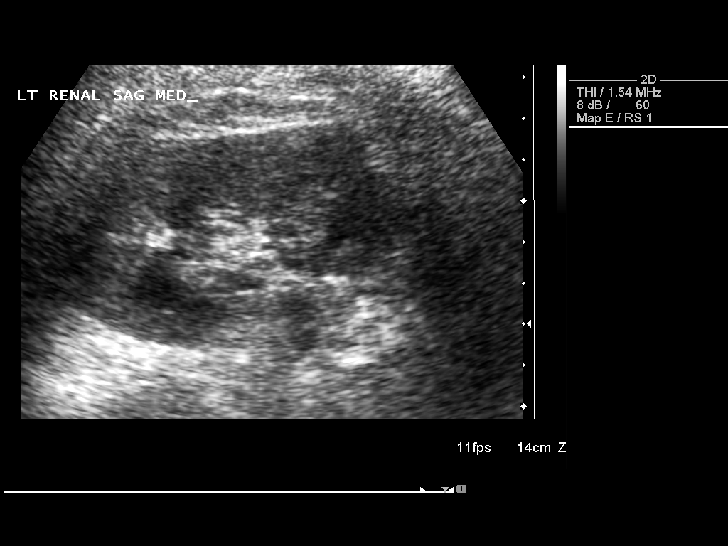
[im 59/65]
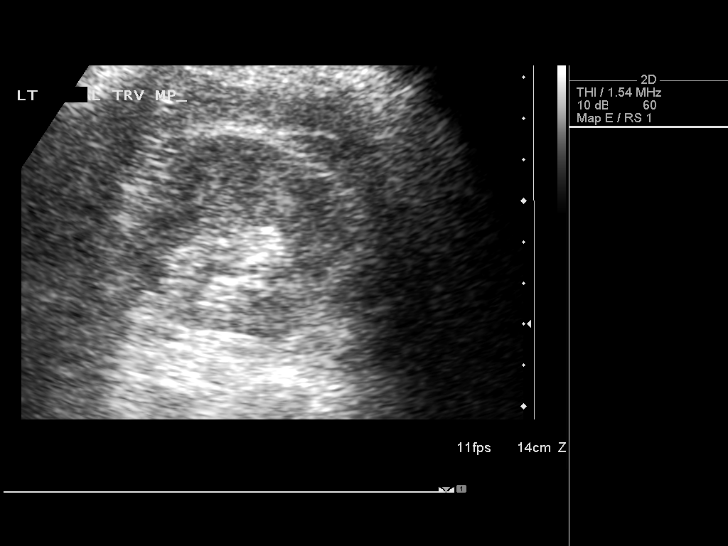
[im 65/65]
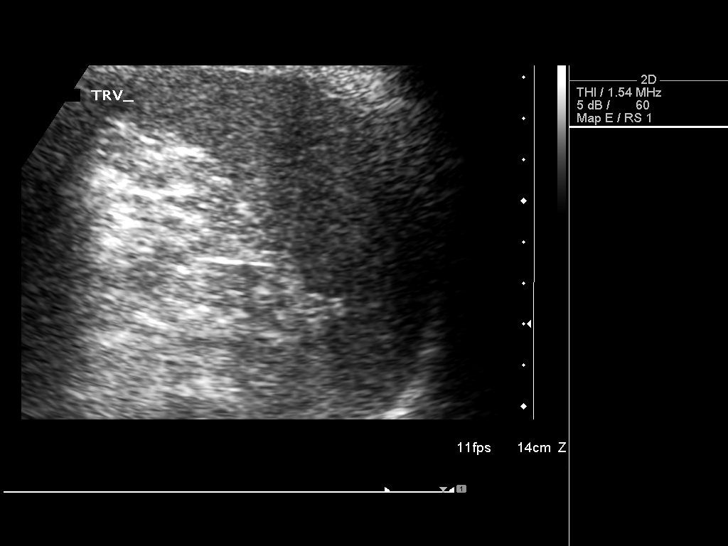

[14 of 25 positions shown; findings below may reference images not displayed]

FINDINGS: Gallbladder:  No gallstones, gallbladder wall thickening, or
pericholecystic fluid.

Common bile duct:  3.1 mm

Liver:  No focal lesion identified.  Within normal limits in
parenchymal echogenicity.

IVC:  Appears normal.

Pancreas:  No focal abnormality seen.

Spleen:  7.8 cm in diameter without focal lesion.

Right Kidney:  9.5 cm in length.  Normal renal cortex without
obstruction or mass.

Left Kidney:  10.0 cm in length.  Normal renal cortex.  No
obstruction or mass.

Abdominal aorta:  No aneurysm identified.
IMPRESSION: No significant abnormality.  No mass or hematoma is identified.

## 2011-02-01 LAB — HEPATITIS B DNA, QUALITATIVE: Hepatitis B Virus DNA Qual: DETECTED — AB

## 2011-03-02 ENCOUNTER — Ambulatory Visit: Payer: Self-pay | Admitting: Family Medicine

## 2011-03-05 ENCOUNTER — Encounter: Payer: Self-pay | Admitting: Family Medicine

## 2011-03-05 ENCOUNTER — Ambulatory Visit (INDEPENDENT_AMBULATORY_CARE_PROVIDER_SITE_OTHER): Payer: Self-pay | Admitting: Family Medicine

## 2011-03-05 VITALS — BP 141/85 | HR 104 | Temp 97.7°F | Ht 58.25 in | Wt 128.0 lb

## 2011-03-05 DIAGNOSIS — B181 Chronic viral hepatitis B without delta-agent: Secondary | ICD-10-CM

## 2011-03-05 DIAGNOSIS — R05 Cough: Secondary | ICD-10-CM | POA: Insufficient documentation

## 2011-03-05 DIAGNOSIS — J019 Acute sinusitis, unspecified: Secondary | ICD-10-CM | POA: Insufficient documentation

## 2011-03-05 MED ORDER — GUAIFENESIN-CODEINE 100-10 MG/5ML PO SYRP: 5.0000 mL | ORAL_SOLUTION | Freq: Three times a day (TID) | ORAL | Status: AC | PRN

## 2011-03-05 MED ORDER — AMOXICILLIN 500 MG PO TABS: 500.0000 mg | ORAL_TABLET | Freq: Two times a day (BID) | ORAL | Status: AC

## 2011-03-05 NOTE — Assessment & Plan Note (Signed)
I think Brenda Brady had a viral URI and now has a postviral cough. Additionally concerned that she may have a bacterial sinus infection with secondary sickening.  She took some leftover ampicillin and did improve.   Plan to treat cough with symptom management such as Tylenol and guaifenesin/codeine.  She will return to clinic in 2 weeks if not improved

## 2011-03-05 NOTE — Progress Notes (Signed)
Brenda Brady is a 36 y.o. female who presents to [facility] today for  Cough for around 2 weeks. Additionally she notes some right upper tooth pain and facial pain for the last 2 days. Getting a bit better. Had bad cold 2 weeks ago.  Now cough keeping up at night.  Meds: Nyquil and ampicillin and now feels better.  Took ampicillin twice a day for 4 days. Has stopped.  No current fevers or chills   PMH reviewed.  ROS as above otherwise neg Medications reviewed. Current Outpatient Prescriptions  Medication Sig Dispense Refill  . telbivudine (TYZEKA) 600 MG tablet Take 600 mg by mouth daily.        Marland Kitchen amoxicillin (AMOXIL) 500 MG tablet Take 1 tablet (500 mg total) by mouth 2 (two) times daily.  10 tablet  0  . guaiFENesin-codeine (ROBITUSSIN AC) 100-10 MG/5ML syrup Take 5 mLs by mouth 3 (three) times daily as needed for cough.  120 mL  0    Exam:  BP 141/85  Pulse 104  Temp(Src) 97.7 F (36.5 C) (Oral)  Ht 4' 10.25" (1.48 m)  Wt 128 lb (58.06 kg)  BMI 26.52 kg/m2 Gen: Well NAD HEENT: EOMI,  MMM retracted tympanic membranes with effusions bilaterally right maxillary sinus tenderness to palpation Lungs: CTABL Nl WOB Heart: RRR no MRG Abd: NABS, NT, ND Exts: Non edematous BL  LE, warm and well perfused.

## 2011-03-05 NOTE — Assessment & Plan Note (Signed)
Filled out paperwork for chronic medication and mail to manufacture

## 2011-03-05 NOTE — Patient Instructions (Signed)
Thank you for coming in today. Take the cough medicine as needed.

## 2011-03-05 NOTE — Assessment & Plan Note (Signed)
I am concerned that Brenda Brady has secondary sickening with bacterial sinusitis she initially had a URI but now has facial pain. Her symptoms did improve with ampicillin.  I will treat with amoxicillin twice daily for 5 days to complete a around one week course.   She expresses understanding and will followup in 2 weeks if not improved

## 2011-05-24 ENCOUNTER — Telehealth: Payer: Self-pay | Admitting: *Deleted

## 2011-05-24 NOTE — Telephone Encounter (Signed)
Patient has Hep B.  Was scheduled for appt with Dr. Kinnie Scales for 05/31/11.  Wanted to inform Dr. [name] that patient canceled appt because she no longer has insurance and said she would reschedule later.  Gaylene Brooks, RN

## 2011-06-04 ENCOUNTER — Telehealth: Payer: Self-pay | Admitting: Family Medicine

## 2011-06-04 MED ORDER — TELBIVUDINE 600 MG PO TABS: 600.0000 mg | ORAL_TABLET | Freq: Every day | ORAL | Status: DC

## 2011-06-04 NOTE — Telephone Encounter (Signed)
Refilled telbivudine via medication assistance program from manufacturer.

## 2011-08-22 ENCOUNTER — Ambulatory Visit: Payer: Medicaid Other | Admitting: Family Medicine

## 2011-09-04 ENCOUNTER — Ambulatory Visit (INDEPENDENT_AMBULATORY_CARE_PROVIDER_SITE_OTHER): Payer: Medicaid Other | Admitting: Family Medicine

## 2011-09-04 VITALS — BP 109/82 | HR 93 | Temp 98.4°F | Ht 58.25 in | Wt 132.3 lb

## 2011-09-04 DIAGNOSIS — B181 Chronic viral hepatitis B without delta-agent: Secondary | ICD-10-CM

## 2011-09-06 ENCOUNTER — Other Ambulatory Visit: Payer: Medicaid Other

## 2011-09-06 DIAGNOSIS — B181 Chronic viral hepatitis B without delta-agent: Secondary | ICD-10-CM

## 2011-09-06 LAB — CBC WITH DIFFERENTIAL/PLATELET
Basophils Relative: 1 % (ref 0–1)
Eosinophils Absolute: 0.6 10*3/uL (ref 0.0–0.7)
Eosinophils Relative: 6 % — ABNORMAL HIGH (ref 0–5)
Lymphs Abs: 2.9 10*3/uL (ref 0.7–4.0)
MCH: 23 pg — ABNORMAL LOW (ref 26.0–34.0)
MCHC: 31.8 g/dL (ref 30.0–36.0)
MCV: 72.3 fL — ABNORMAL LOW (ref 78.0–100.0)
Neutrophils Relative %: 57 % (ref 43–77)
Platelets: 302 10*3/uL (ref 150–400)
RDW: 15.4 % (ref 11.5–15.5)

## 2011-09-06 LAB — COMPREHENSIVE METABOLIC PANEL
ALT: 39 U/L — ABNORMAL HIGH (ref 0–35)
Alkaline Phosphatase: 57 U/L (ref 39–117)
CO2: 27 mEq/L (ref 19–32)
Creat: 0.68 mg/dL (ref 0.50–1.10)
Sodium: 136 mEq/L (ref 135–145)
Total Bilirubin: 0.6 mg/dL (ref 0.3–1.2)
Total Protein: 7.7 g/dL (ref 6.0–8.3)

## 2011-09-06 NOTE — Progress Notes (Signed)
CBC WITH DIFF,CMP AND HEP B DNA ULTRAQUANTITATIVE DONE TODAY Jossalin Chervenak

## 2011-09-11 ENCOUNTER — Encounter: Payer: Self-pay | Admitting: Family Medicine

## 2011-09-11 LAB — HEPATITIS B DNA, ULTRAQUANTITATIVE, PCR: Hepatitis B DNA: 868 IU/mL — ABNORMAL HIGH (ref <20)

## 2011-09-11 NOTE — Assessment & Plan Note (Signed)
I filled out manufacturer assistance program form and have ordered appropriate labs. I have faxed the paperwork to be provided fax number. The patient was given back the original copy of the paperwork. Otherwise there are no acute issues. The patient is tolerating the medicine well.

## 2011-09-11 NOTE — Progress Notes (Signed)
Patient ID: Brenda Brady, female   DOB: 03-27-1975, 36 y.o.   MRN: [id number] Subjective: The patient is a 36 y.o. year old female who presents today for an appointment to meet new physician.  1. Chronic active viral hepatitis B: The patient has a long-standing history of chronic active hepatitis B. She is on medication for this through a manufacturer assistance program. She needs renewal paperwork for this filled out. She reports occasional right upper abdominal discomfort but otherwise is symptom-free. She denies any pruritus, weight loss, changes in stools, nausea, vomiting, or lower extremity edema.  Patient's past medical, social, and family history were reviewed and updated as appropriate. History  Substance Use Topics  . Smoking status: Never Smoker   . Smokeless tobacco: Never Used  . Alcohol Use: No   Objective:  Filed Vitals:   09/04/11 1630  BP: 109/82  Pulse: 93  Temp: 98.4 F (36.9 C)   Gen: No acute distress CV: Regular rate and rhythm Resp: Clear to auscultation bilaterally Abdomen: Soft, nontender, nondistended. There is no right upper quadrant tenderness. Liver edge is just able to be palpated at the base of the ribs. Ext: No edema  Assessment/Plan: Greater than 25 minutes were spent with this patient over 50% of which was spent discussing her medical history, answering questions, and coordinating patient care.  Please also see individual problems in problem list for problem-specific plans.

## 2011-10-05 ENCOUNTER — Ambulatory Visit (INDEPENDENT_AMBULATORY_CARE_PROVIDER_SITE_OTHER): Payer: Medicaid Other | Admitting: Family Medicine

## 2011-10-05 ENCOUNTER — Encounter: Payer: Self-pay | Admitting: Family Medicine

## 2011-10-05 VITALS — BP 116/80 | HR 88 | Temp 98.5°F | Ht 58.25 in | Wt 131.2 lb

## 2011-10-05 DIAGNOSIS — B181 Chronic viral hepatitis B without delta-agent: Secondary | ICD-10-CM

## 2011-10-05 NOTE — Progress Notes (Signed)
Patient ID: Brenda Brady, female   DOB: January 02, 1976, 36 y.o.   MRN: [id number] Subjective: The patient is a 36 y.o. year old female who presents today for f/u and family hep B.  Patient has been aware of diagnosis since 2004.  Apparently older sister had disease but otherwise no known source.  Patient was checked due to family history.  Discussed positive Hep B results for her daughter and the need for full testing on other family members.  Also discussed need for her husband to become patient and be evaluated for immunity.  Patient's past medical, social, and family history were reviewed and updated as appropriate. History  Substance Use Topics  . Smoking status: Never Smoker   . Smokeless tobacco: Never Used  . Alcohol Use: No   Objective:  Filed Vitals:   10/05/11 1408  BP: 116/80  Pulse: 88  Temp: 98.5 F (36.9 C)   No exam performed today  Assessment/Plan:  Please also see individual problems in problem list for problem-specific plans.

## 2011-10-15 NOTE — Assessment & Plan Note (Signed)
Has not received new doses of medication now.  Will need to look into whether our ID clinic would be able to follow her/help.  Have arranged for hep B series in son along with testing for entire family.  Once have some additional info on daughter will refer to specialist.

## 2011-11-07 ENCOUNTER — Ambulatory Visit (INDEPENDENT_AMBULATORY_CARE_PROVIDER_SITE_OTHER): Payer: Self-pay | Admitting: *Deleted

## 2011-11-07 DIAGNOSIS — Z23 Encounter for immunization: Secondary | ICD-10-CM

## 2012-01-21 ENCOUNTER — Ambulatory Visit (INDEPENDENT_AMBULATORY_CARE_PROVIDER_SITE_OTHER): Payer: No Typology Code available for payment source | Admitting: Family Medicine

## 2012-01-21 ENCOUNTER — Encounter: Payer: Self-pay | Admitting: Family Medicine

## 2012-01-21 VITALS — BP 125/86 | HR 81 | Temp 98.5°F | Ht 58.25 in | Wt 130.0 lb

## 2012-01-21 DIAGNOSIS — N898 Other specified noninflammatory disorders of vagina: Secondary | ICD-10-CM | POA: Insufficient documentation

## 2012-01-21 DIAGNOSIS — R42 Dizziness and giddiness: Secondary | ICD-10-CM

## 2012-01-21 LAB — COMPREHENSIVE METABOLIC PANEL
Albumin: 4.3 g/dL (ref 3.5–5.2)
BUN: 11 mg/dL (ref 6–23)
CO2: 27 mEq/L (ref 19–32)
Glucose, Bld: 77 mg/dL (ref 70–99)
Potassium: 4 mEq/L (ref 3.5–5.3)
Sodium: 138 mEq/L (ref 135–145)
Total Bilirubin: 0.3 mg/dL (ref 0.3–1.2)
Total Protein: 7.5 g/dL (ref 6.0–8.3)

## 2012-01-21 LAB — POCT WET PREP (WET MOUNT)

## 2012-01-21 LAB — CBC
Hemoglobin: 11.9 g/dL — ABNORMAL LOW (ref 12.0–15.0)
Platelets: 322 10*3/uL (ref 150–400)
RBC: 5.17 MIL/uL — ABNORMAL HIGH (ref 3.87–5.11)
WBC: 12.8 10*3/uL — ABNORMAL HIGH (ref 4.0–10.5)

## 2012-01-21 LAB — TSH: TSH: 3.348 u[IU]/mL (ref 0.350–4.500)

## 2012-01-21 MED ORDER — MECLIZINE HCL 50 MG PO TABS: 50.0000 mg | ORAL_TABLET | Freq: Two times a day (BID) | ORAL | Status: DC | PRN

## 2012-01-21 MED ORDER — FLUCONAZOLE 150 MG PO TABS: 150.0000 mg | ORAL_TABLET | Freq: Once | ORAL | Status: DC

## 2012-01-21 NOTE — Assessment & Plan Note (Addendum)
Exam essentially normal, no concern for PID or infection. IUD appears wnl. Slight discharge, will f/u wet prep. Sent diflucan empirically since itch is main complaint.

## 2012-01-21 NOTE — Assessment & Plan Note (Signed)
History slightly difficult, but seems like BPPV with symptoms elicited by Gilberto Better, distinguishes in several seconds. No neurologic signs noted. Already improving, advised she may use antivert if needed but not necessary. Will check basic labs with her medication to rule out anemia or other abnormalities. Advised she f/u in clinic in 1-2 weeks with PCP or call for worsening.

## 2012-01-21 NOTE — Patient Instructions (Signed)
Nice to meet you. Will check labs to make sure ok. I think you have BPPV which is not dangerous. If you dont get better in next week, please make an appointment with Dr. Louanne Belton. I will call if your lab tests show abnormal results.

## 2012-01-21 NOTE — Progress Notes (Signed)
  Subjective:    Patient ID: Brenda Brady, female    DOB: 1975/07/06, 36 y.o.   MRN: [id number]  HPI Live Interpretor and husband are present.   1. Vaginal itching. Yellow discharge and itching recently. Has a mirena and wants it checked today, placed 2 years ago and wishes to keep this if possible. Denies fever, chills, pain, bleeding.   2. Dizziness. Does describe as room spinning sometimes, though this isn't perfectly clear through the interpretor.Notices when she is getting out of bed, she feels faint and has to sit on edge for a minute, then takes off walking. Also notes at night when she is getting into bed. Started 3 days ago and improving. Worse with position changes and head movements.  Has infrequent headaches that are stable and not active. No ear pain, hearing loss, vision changes, nausea, emesis, blood in stool, falls, weakness, dyspnea. She does not drink much fluid, but has a good appetite.  Taking med for Hep B.  Reports history of falling in 2004 in [place].   Review of Systems See HPI otherwise negative.  reports that she has never smoked. She has never used smokeless tobacco.     Objective:   Physical Exam  Vitals reviewed. Constitutional: She is oriented to person, place, and time. She appears well-developed and well-nourished. No distress.  HENT:  Head: Normocephalic and atraumatic.  Right Ear: External ear normal.  Left Ear: External ear normal.  Nose: Nose normal.  Mouth/Throat: Oropharynx is clear and moist. No oropharyngeal exudate.       TMs clear. No sinus TTP.  Eyes: EOM are normal. Pupils are equal, round, and reactive to light.  Neck: Normal range of motion. Neck supple. No thyromegaly present.  Cardiovascular: Normal rate, regular rhythm and normal heart sounds.   No murmur heard. Pulmonary/Chest: Effort normal and breath sounds normal.  Abdominal: Soft. Bowel sounds are normal. She exhibits no distension. There is no tenderness. There is no rebound.   Genitourinary:       IUD strings visible. Slight mucoid discharge noted. No mucosal lesion or cervical motion tenderness.   Musculoskeletal: She exhibits no edema and no tenderness.  Lymphadenopathy:    She has no cervical adenopathy.  Neurological: She is alert and oriented to person, place, and time. No cranial nerve deficit. Coordination normal.       dix hallpike is positive to the right (elicits symptoms, no nystagmus noted) Finger nose intact. Normal gait.  Skin: No rash noted. She is not diaphoretic.  Psychiatric: She has a normal mood and affect.       Assessment & Plan:

## 2012-01-22 ENCOUNTER — Encounter: Payer: Self-pay | Admitting: Family Medicine

## 2012-02-08 ENCOUNTER — Ambulatory Visit: Payer: No Typology Code available for payment source | Admitting: Family Medicine

## 2012-03-21 ENCOUNTER — Ambulatory Visit: Payer: Self-pay | Admitting: Family Medicine

## 2012-09-09 ENCOUNTER — Ambulatory Visit: Payer: Self-pay

## 2012-10-20 ENCOUNTER — Telehealth: Payer: Self-pay | Admitting: Family Medicine

## 2012-10-20 ENCOUNTER — Ambulatory Visit: Payer: No Typology Code available for payment source | Admitting: Family Medicine

## 2012-10-20 NOTE — Telephone Encounter (Signed)
Pt came to request rx [employer]) form to be complete . Please call pt and let her knows when form was sent by mail.   [place]

## 2012-10-20 NOTE — Telephone Encounter (Signed)
Form placed in Dr Claiborne Billings box for completion & signature. Kamala Kolton, Virgel Bouquet

## 2012-10-21 NOTE — Telephone Encounter (Signed)
Patient will need to make an appointment so we can discuss her concerns and medication need. Thanks

## 2012-10-21 NOTE — Telephone Encounter (Signed)
Related message,patient states she has an appointment 11/05/2012. Kobe Jansma, Virgel Bouquet

## 2012-10-25 ENCOUNTER — Telehealth: Payer: Self-pay | Admitting: Family Medicine

## 2012-10-25 NOTE — Telephone Encounter (Signed)
Medication assistance papers completed and will placed out front front for patient pick-up.

## 2012-11-05 ENCOUNTER — Ambulatory Visit (INDEPENDENT_AMBULATORY_CARE_PROVIDER_SITE_OTHER): Payer: No Typology Code available for payment source | Admitting: Family Medicine

## 2012-11-05 ENCOUNTER — Ambulatory Visit: Payer: No Typology Code available for payment source | Admitting: Family Medicine

## 2012-11-05 ENCOUNTER — Encounter: Payer: Self-pay | Admitting: Family Medicine

## 2012-11-05 VITALS — BP 117/80 | HR 82 | Temp 98.2°F | Ht 58.25 in | Wt 132.5 lb

## 2012-11-05 DIAGNOSIS — B181 Chronic viral hepatitis B without delta-agent: Secondary | ICD-10-CM | POA: Insufficient documentation

## 2012-11-05 DIAGNOSIS — Z0000 Encounter for general adult medical examination without abnormal findings: Secondary | ICD-10-CM | POA: Insufficient documentation

## 2012-11-05 DIAGNOSIS — Z23 Encounter for immunization: Secondary | ICD-10-CM

## 2012-11-05 LAB — COMPREHENSIVE METABOLIC PANEL
Albumin: 4.3 g/dL (ref 3.5–5.2)
BUN: 8 mg/dL (ref 6–23)
CO2: 25 mEq/L (ref 19–32)
Calcium: 9.1 mg/dL (ref 8.4–10.5)
Chloride: 102 mEq/L (ref 96–112)
Glucose, Bld: 74 mg/dL (ref 70–99)
Potassium: 4 mEq/L (ref 3.5–5.3)

## 2012-11-05 NOTE — Assessment & Plan Note (Signed)
Patient given flu shot today Discussed the need of repeat Pap smear prior to January. Patient is in understanding and states she will make an appointment.

## 2012-11-05 NOTE — Assessment & Plan Note (Signed)
Repeat hepatitis B ultra quantitative viral load today along with liver function tests. These were done every 6 months. Fill out papers for her manufacturer since program for her telbivudine about 2 weeks ago

## 2012-11-05 NOTE — Progress Notes (Signed)
Subjective:     Patient ID: Brenda Brady, female   DOB: 03-22-1975, 37 y.o.   MRN: [id number]  HPI Hepatitis B Patient is here for her six-month evaluation of her liver function. She denies abdominal pain or fever, endorses regular stools. Reports she is feeling well, does not endorse fatigue. She's had no episodes of hemoptysis.  Health maintenance Patient is due for flu shot. She will be due for her Pap smear in December. We discussed this with the interpreter and she is in full understanding. She is open to having her flu shot today.  Review of Systems Negative, with the exception of above mentioned in HPI     Objective:   Physical Exam BP 117/80  Pulse 82  Temp(Src) 98.2 F (36.8 C) (Oral)  Ht 4' 10.25" (1.48 m)  Wt 132 lb 8 oz (60.102 kg)  BMI 27.44 kg/m2 Gen: NAD. Pleasant. needs interpreter  For most of interview. HEENT: AT. [place]. Bilateral eyes without injections or icterus. MMM.  CV: RRR * Chest: CTAB, no wheeze or crackles Abd: Soft. NTND. BS normal. no Masses palpated, no hepatosplenomegaly  Ext: No erythema. No edema.  Skin: no rashes, purpura or petechiae.  Neuro: Normal gait.  Grossly intact.  Psych: appropriate dress, affect and demeanor

## 2012-11-05 NOTE — Patient Instructions (Addendum)
It was a pleasure seeing you again today. We are giving you your flu shot today. I am also getting your hepatitis B lab work to evaluate your liver function. We'll let you know the results of these when they come in. Please make an appointment to have a Pap smear completed before the end of December this year.

## 2012-11-07 LAB — HEPATITIS B DNA, ULTRAQUANTITATIVE, PCR
Hepatitis B DNA (Calc): 93311821 copies/mL — ABNORMAL HIGH (ref <116)
Hepatitis B DNA: 16032959 IU/mL — ABNORMAL HIGH (ref <20)

## 2012-11-16 ENCOUNTER — Telehealth: Payer: Self-pay | Admitting: Family Medicine

## 2012-11-16 NOTE — Telephone Encounter (Signed)
Please call Brenda Brady and inform her lab results for hepatitis DNA are elevated. I will need to send her back to GI doctor for this. Please ask her what Doctor she had followed with for this, I can not find documentation of any GI visits recently. Thanks. She does not speak [place].

## 2012-11-16 NOTE — Telephone Encounter (Deleted)
Please call patient (does not speak English) and inform her Hep DNA is elevated. I would like to send her back to GI doctor for this, but I am uncertain where she has been following for her hepatitis if anywhere.

## 2012-11-21 ENCOUNTER — Telehealth: Payer: Self-pay | Admitting: Family Medicine

## 2012-11-21 NOTE — Telephone Encounter (Signed)
Copy message from 10/12: Please call Brenda Brady and inform her lab results for hepatitis DNA are elevated. I will need to send her back to GI doctor for this. Please ask her what Doctor she had followed with for this, I can not find documentation of any GI visits recently. Thanks.  She does not speak [place].   White team: Has this been taken care of, she needs to revisit GI ASAP.  Thanks

## 2012-11-28 ENCOUNTER — Other Ambulatory Visit (HOSPITAL_COMMUNITY)
Admission: RE | Admit: 2012-11-28 | Discharge: 2012-11-28 | Disposition: A | Discharge status: Home / Self Care | Payer: No Typology Code available for payment source | Source: Ambulatory Visit | Attending: Family Medicine | Admitting: Family Medicine

## 2012-11-28 ENCOUNTER — Encounter: Payer: Self-pay | Admitting: Family Medicine

## 2012-11-28 ENCOUNTER — Ambulatory Visit (INDEPENDENT_AMBULATORY_CARE_PROVIDER_SITE_OTHER): Payer: No Typology Code available for payment source | Admitting: Family Medicine

## 2012-11-28 VITALS — BP 116/76 | HR 101 | Temp 98.1°F | Ht 58.25 in | Wt 132.8 lb

## 2012-11-28 DIAGNOSIS — Z113 Encounter for screening for infections with a predominantly sexual mode of transmission: Secondary | ICD-10-CM | POA: Insufficient documentation

## 2012-11-28 DIAGNOSIS — Z124 Encounter for screening for malignant neoplasm of cervix: Secondary | ICD-10-CM

## 2012-11-28 DIAGNOSIS — B181 Chronic viral hepatitis B without delta-agent: Secondary | ICD-10-CM

## 2012-11-28 DIAGNOSIS — Z01419 Encounter for gynecological examination (general) (routine) without abnormal findings: Secondary | ICD-10-CM

## 2012-11-28 DIAGNOSIS — N760 Acute vaginitis: Secondary | ICD-10-CM

## 2012-11-28 LAB — POCT WET PREP (WET MOUNT): Clue Cells Wet Prep Whiff POC: NEGATIVE

## 2012-11-29 LAB — HEPATITIS C ANTIBODY, REFLEX: HCV Ab: NEGATIVE

## 2012-11-29 LAB — HIV ANTIBODY (ROUTINE TESTING W REFLEX): HIV: NONREACTIVE

## 2012-12-01 ENCOUNTER — Telehealth: Payer: Self-pay | Admitting: Family Medicine

## 2012-12-01 ENCOUNTER — Encounter: Payer: Self-pay | Admitting: Family Medicine

## 2012-12-01 DIAGNOSIS — Z01419 Encounter for gynecological examination (general) (routine) without abnormal findings: Secondary | ICD-10-CM | POA: Insufficient documentation

## 2012-12-01 MED ORDER — TELBIVUDINE 600 MG PO TABS: 600.0000 mg | ORAL_TABLET | Freq: Every day | ORAL | Status: DC

## 2012-12-01 NOTE — Assessment & Plan Note (Signed)
Pap smear completed today and is normal. IUD placed in 2011 G./C. was negative. Wet prep negative. Next Pap in 3 years.

## 2012-12-01 NOTE — Patient Instructions (Signed)
Verbal instructions given with interpreter.

## 2012-12-01 NOTE — Telephone Encounter (Signed)
**  through interpreter line** attempted to call it is a very rare language to find. Will attempt later

## 2012-12-01 NOTE — Progress Notes (Signed)
Subjective:     Patient ID: Brenda Brady, female   DOB: 03/08/1975, 37 y.o.   MRN: [id number]  HPI  Well women exam: Patient reports IUD in place since 2011, she does not experience periods. She complains of a white discharge and itching vaginally. She does not desire more children. She is sexually active.  Active Hepatitis B/carrier:  Review of recent lab results of elevated hep B DNA was achieved with telephone interpreter. Patient reports she does not have a hepatitis/hepatologist/GI doctor currently. She states she cannot afford to have bills. Explained in great detail her elevated hep B DNA and need to be seen by specialist.  Patient currently on Tyzeka for her hepatitis and states she has been taking her medication daily as prescribed. Again asked to she has been experiencing any symptoms that would correlate with her hepatitis and she states that before her the only symptoms she has noticed dizziness. She does not endorse dizziness today.  She does admit to right upper quadrant pain the last one to 2 months when prompted. She denies increase in bleeding or bruising. She again asks for her paperwork to be filled out in order to get her free medications from the company. I had filled out the papers 2 weeks prior, but paperwork required by her to fill out was not completed accurately and the company seems to have lost her paperwork now.   Review of Systems Negative, with the exception of above mentioned in HPI  Objective:   Physical Exam BP 116/76  Pulse 101  Temp(Src) 98.1 F (36.7 C) (Oral)  Ht 4' 10.25" (1.48 m)  Wt 132 lb 12.8 oz (60.238 kg)  BMI 27.5 kg/m2 Gen: NAD. Used personal and telephone interpreter.  Chest: CTAB, no wheeze or crackles Abd: Soft. NTND. BS present. No Masses palpated. No RUQ pain on exam.  GYN:  External genitalia within normal limits.  Vaginal mucosa pink, moist, normal rugae.  Nonfriable cervix without lesions, nabothian cysts at 6 o'clock x2.  No bleeding noted  on speculum exam.  Moderate mucous-like yellowish discharge present. Bimanual exam revealed normal, nongravid uterus.  No cervical motion tenderness. No adnexal masses bilaterally.

## 2012-12-01 NOTE — Assessment & Plan Note (Signed)
Patient with chronic active viral hepatitis B with extremely elevated hepatitis B DNA. Patient has been taking Tyzeka. Endorses compliance. She used to see Dr. Jeffrey Medoff for hepatitis B. She has not been taken in years due to lack of insurance. Extremely lengthy discussion with patient via phone interpreter about test results and need to see a specialist. Patient has concerns because she states she is unable to afford a specialist. At this point it is not option, as this is not something that can be managed at family practice. Patient is in understanding of plan.  LFTs and alkaline phosphatase are normal. HIV and hepatitis C are negative. I have ordered a right upper quadrant ultrasound. Have placed an urgent referral to GI.  Have filled out forms and have had been mailed to her to have medications provided for free. 

## 2012-12-01 NOTE — Assessment & Plan Note (Signed)
Patient with chronic active viral hepatitis B with extremely elevated hepatitis B DNA. Patient has been taking Tyzeka. Endorses compliance. She used to see Dr. Sharrell Ku for hepatitis B. She has not been taken in years due to lack of insurance. Extremely lengthy discussion with patient via phone interpreter about test results and need to see a specialist. Patient has concerns because she states she is unable to afford a specialist. At this point it is not option, as this is not something that can be managed at family practice. Patient is in understanding of plan.  LFTs and alkaline phosphatase are normal. HIV and hepatitis C are negative. I have ordered a right upper quadrant ultrasound. Have placed an urgent referral to GI.  Have filled out forms and have had been mailed to her to have medications provided for free.

## 2012-12-01 NOTE — Telephone Encounter (Signed)
Patient needs rx written for Tyzeka so that she can get medication assistance form filled out to get meds from [employer].  Please call her if you have questions regarding this.  She also says that she left paperwork for dr to fill out when she was here last week and that she needs that faxed in also.

## 2012-12-02 ENCOUNTER — Telehealth: Payer: Self-pay | Admitting: Family Medicine

## 2012-12-02 NOTE — Telephone Encounter (Signed)
Vern with [employer] Patient Assistance program needs Dr Blenda Bridegroom DEA number

## 2012-12-03 ENCOUNTER — Encounter: Payer: Self-pay | Admitting: Family Medicine

## 2012-12-03 ENCOUNTER — Ambulatory Visit (HOSPITAL_COMMUNITY)
Admission: RE | Admit: 2012-12-03 | Discharge: 2012-12-03 | Disposition: A | Discharge status: Home / Self Care | Payer: No Typology Code available for payment source | Source: Ambulatory Visit | Attending: Family Medicine | Admitting: Family Medicine

## 2012-12-03 ENCOUNTER — Other Ambulatory Visit: Payer: Self-pay | Admitting: Family Medicine

## 2012-12-03 DIAGNOSIS — B181 Chronic viral hepatitis B without delta-agent: Secondary | ICD-10-CM

## 2012-12-03 DIAGNOSIS — B1910 Unspecified viral hepatitis B without hepatic coma: Secondary | ICD-10-CM | POA: Insufficient documentation

## 2012-12-03 NOTE — Telephone Encounter (Signed)
Spoke with vern with [employer]. Gave DEA numbre and will fax out paperwork to them.

## 2012-12-11 ENCOUNTER — Telehealth: Payer: Self-pay | Admitting: Family Medicine

## 2012-12-11 DIAGNOSIS — B181 Chronic viral hepatitis B without delta-agent: Secondary | ICD-10-CM

## 2012-12-11 NOTE — Telephone Encounter (Signed)
Replacement of GI referral for internal medicine referral concerning her Hep B. Thanks.

## 2013-02-03 ENCOUNTER — Encounter: Payer: Self-pay | Admitting: Family Medicine

## 2013-02-03 ENCOUNTER — Telehealth: Payer: Self-pay | Admitting: Family Medicine

## 2013-02-03 NOTE — Telephone Encounter (Signed)
Pt requested refill on Tyzeka for her Hepatitis  B, during her sons well child appointment. Advised patient she needs to make an appointment with the [facility] clinic for her Hepatitis B. Explained again her last lab values were extremely high, meaning that her medication is not effective for her. This referral was made after her last appointment. They have attempted to call her and the patient has not returned their call. Explained, Again, in great detail this medication is not working and she needs to be placed on another medication or add another medication. Information was given again to patient and advised to contact the Liver clinic ASAP (today).  I have completed the request paperwork and it will be faxed today.  Brenda Brady

## 2013-02-04 ENCOUNTER — Encounter: Payer: Self-pay | Admitting: Family Medicine

## 2013-02-04 ENCOUNTER — Ambulatory Visit (INDEPENDENT_AMBULATORY_CARE_PROVIDER_SITE_OTHER): Payer: No Typology Code available for payment source | Admitting: Family Medicine

## 2013-02-04 VITALS — BP 117/77 | HR 99 | Temp 99.3°F | Ht 58.25 in | Wt 131.0 lb

## 2013-02-04 DIAGNOSIS — R059 Cough, unspecified: Secondary | ICD-10-CM | POA: Insufficient documentation

## 2013-02-04 DIAGNOSIS — R05 Cough: Secondary | ICD-10-CM | POA: Insufficient documentation

## 2013-02-04 MED ORDER — BENZONATATE 100 MG PO CAPS: 100.0000 mg | ORAL_CAPSULE | Freq: Two times a day (BID) | ORAL | Status: DC | PRN

## 2013-02-04 NOTE — Assessment & Plan Note (Addendum)
Likely viral infection, no measured fever today and overall appears fairly well. Given exam, will hold off on treating with antibiotics. Plan: tessalon perles, OTC medications. If symptoms persist or get worse she will call clinic and re-evaluate decision for antibiotics at that time.

## 2013-02-04 NOTE — Patient Instructions (Addendum)
Your symptoms are due to a viral illness. Antibiotics will not help improve your symptoms, but the following will help you feel better while your body fights the virus.   Drink lots of water  For your cough: take the [employer]. DO NOT CHEW THEM. If this medication does not work, you can try other over the counter medications such as: Guafenesin containing products like Robitussin DM or Mucinex, Dayquil/Nyquil.  Instructions for Tessalon Perles: Take 1 tablet (100mg ) every 8 hours as needed. If 1 tablet is not enough, you can try taking 2 the next dose (200mg  total). Do not take more than 6 tablets in one day.  If Congestion develops:   Nose spray: Afrin (Phenylephrine). DO NOT USE MORE THAN 3 DAYS  Oral: Pseudoephedrine  Sneezing & Runny nose: Antihistamines: Zyrtec, Claritin, Allegra  Pain/Sore throat: Tylenol, Ibuprofen  Wash your hands often  If your symptoms fail to improve in the next 1-2 weeks, your symptoms get worse, then please call the clinic and we will decide if antibiotics are the best option at that time.

## 2013-02-04 NOTE — Progress Notes (Signed)
   Subjective:    Patient ID: Brenda Brady, female    DOB: Aug 19, 1975, 37 y.o.   MRN: [id number]  HPI  Speaks some [place], husband used as Equities trader.   CC: Cough  Brenda Brady is a 37 y.o. female presenting with 6 day history of cough that started on Christmas. Cough is consistent throughout the day and has kept her up at night. It is occasionally productive of yellow mucous. She has an associated sore throat that started after the cough. She has no nasal congestion, runny nose, or ear pain. She has had 2 episodes of post-tussive emesis. She denies any measured fevers, but has had chills. She occasionally feels she has noisy breathing, but denies any shortness of breath. She is unsure about any sick contacts.  Review of Systems No ear pain, no rhinorrhea, no fevers, no shortness of breath    Objective:   Physical Exam BP 117/77  Pulse 99  Temp(Src) 99.3 F (37.4 C) (Oral)  Ht 4' 10.25" (1.48 m)  Wt 131 lb (59.421 kg)  BMI 27.13 kg/m2  General: NAD, sitting on exam table HEENT: PERRL, EOMI. TMs pearly gray bilaterally. Oropharynx with only mild erythema, no exudate. CV: RRR, normal heart sounds Resp: CTAB, normal effort. No crackles, wheezes, or rhonchi appreciated Ext: no edema or cyanosis.     Assessment & Plan:  See Problem List documentation

## 2013-02-06 ENCOUNTER — Encounter: Payer: Self-pay | Admitting: Family Medicine

## 2013-02-19 ENCOUNTER — Telehealth: Payer: Self-pay | Admitting: Family Medicine

## 2013-02-19 DIAGNOSIS — B181 Chronic viral hepatitis B without delta-agent: Secondary | ICD-10-CM

## 2013-02-19 NOTE — Telephone Encounter (Signed)
I have asked for an internal medicine referral since patient is unable to afford Liver Clinic. She has active hep B and is not controlled by medications. It has been explained to her multiple times with family and interpreter that she has active disease and her medications are not working. If she is not seen by a specialist, her disease will continue to progress with a likely  bad outcome. I have asked her to come in for an additional appointment to have her lab work completed, as it is due again. Patient and family friend Loss adjuster, chartered) was in understanding.

## 2013-02-23 ENCOUNTER — Telehealth: Payer: Self-pay | Admitting: Family Medicine

## 2013-02-23 NOTE — Telephone Encounter (Signed)
Dr. Raoul Pitch  Did the referral to [facility] looks like they are taking this pts NOW.  [place]

## 2013-02-26 ENCOUNTER — Ambulatory Visit: Payer: Self-pay | Admitting: Internal Medicine

## 2013-03-02 ENCOUNTER — Encounter: Payer: Self-pay | Admitting: Family Medicine

## 2013-03-02 NOTE — Progress Notes (Signed)
Patient ID: Brenda Brady, female   DOB: 03-24-1975, 38 y.o.   MRN: [id number] [facility] liver referral has been placed since last July. They have attempted to call pt with interpreter multiple times without success. She has been made aware on multiple appointments (for her son), that she needs to contact them. She was handed contact information by myself.  [facility] Liver Referral [phone] Lauralyn Primes, LPN)  Update 76/72: pt has finally called liver clinic 12/30 and refused treatment d/t to $50 copay that would be required. Pt is an orange card holder. She has been informed on multiple occassions that her medication is no longer effective and her disease has progressed. If not treated appropriately she is endangering her health and life.   Update 1/26: [facility] was able to get the patient in to ID free of charge and pt no showed for appt. Pt did not come for referral appt with Dr. Michel Bickers, [facility], Thurs., Jan. 22, 2015.

## 2013-03-10 ENCOUNTER — Ambulatory Visit: Payer: No Typology Code available for payment source

## 2013-03-19 ENCOUNTER — Encounter: Payer: Self-pay | Admitting: Family Medicine

## 2013-03-19 DIAGNOSIS — B181 Chronic viral hepatitis B without delta-agent: Secondary | ICD-10-CM

## 2013-03-24 ENCOUNTER — Ambulatory Visit: Payer: No Typology Code available for payment source | Admitting: Family Medicine

## 2013-04-03 ENCOUNTER — Encounter: Payer: Self-pay | Admitting: Family Medicine

## 2013-04-03 ENCOUNTER — Ambulatory Visit (INDEPENDENT_AMBULATORY_CARE_PROVIDER_SITE_OTHER): Payer: No Typology Code available for payment source | Admitting: Family Medicine

## 2013-04-03 VITALS — BP 136/86 | HR 84 | Temp 98.2°F | Ht 58.5 in | Wt 133.0 lb

## 2013-04-03 DIAGNOSIS — B181 Chronic viral hepatitis B without delta-agent: Secondary | ICD-10-CM

## 2013-04-03 NOTE — Assessment & Plan Note (Signed)
Patient now will make contact with infectious disease. Contact information to infectious disease is giving to patient and patient's friend who is English-speaking and a retired Marine scientist. Patient has given verbal consent to allow this person to make her appointment for her. We will no longer be providing her with medications and she will get all hepatitis followup with infectious disease from this point forward. She did have a normal ultrasound in October of 2014. Her hepatitis B DNA ultraquantitative calculated was [id number] in October. I have repeated this today. LFTs were normal in October.

## 2013-04-03 NOTE — Progress Notes (Signed)
   Subjective:    Patient ID: Brenda Brady, female    DOB: 13-May-1975, 38 y.o.   MRN: [id number]  HPI  Active Hep B: Pt is here today to discuss letter I sent to her concerning her not following up with referrals for Hep B. There is a language barrier and patient is with a interpreter today. She speaks [place]. She now also brings a neighbor, who speaks [place] and has some medical back ground, with her. She reports she was not contacted and did not have an appointment for infectious disease. The system reports multiple contact attempts by infectious disease and a no show appointment. It is in my belief she likely thought this was the Liver Clinic trying to make an appointment. She also states she was unable to go to the Liver clinic d/t finances. Her orange card had lapsed, and she had to reapply and that was why she did not make appointment with myself. She is now an orange card holder again. Detailed conversation today about infectious disease, possible of $50 co-pay and the in depth conversation over the importance and health risk associated with no caring for herself and following through with these referrals.  Pt does not report feeling more fatigued, Abdominal pain., fever, changes in urine, nausea or vomit. [name] in 11/2012 was normal. Hep B DNA (calc) [id number] 4 months ago.    Review of Systems Negative, with the exception of above mentioned in HPI    Objective:   Physical Exam BP 136/86  Pulse 84  Temp(Src) 98.2 F (36.8 C) (Oral)  Ht 4' 10.5" (1.486 m)  Wt 133 lb (60.328 kg)  BMI 27.32 kg/m2 Gen: NAD.  HEENT: AT. [place]. Bilateral TM visualized and normal in appearance. Bilateral eyes without  icterus.  CV: RRR  Chest: CTAB, no wheeze or crackles Abd: Soft.  NTND. BS present. Mildly enlarged liver.  Ext: No erythema. No edema.

## 2013-04-03 NOTE — Patient Instructions (Signed)
The infectious disease [facility] number is [phone]. There may be a one-time visit charged $50. I am not sure of the cost thereafter in you'll have to speak to the infectious disease department to find out if they have any additional charges.   You will report to them for any labs, imaging or medication issues with her hepatitis from this point forward. All of your other medical needs can be cared for her at [facility].

## 2013-04-08 LAB — HEPATITIS B DNA, ULTRAQUANTITATIVE, PCR
HEPATITIS B DNA (CALC): 442761674 {copies}/mL — AB (ref <116)
Hepatitis B DNA: 76075889 IU/mL — ABNORMAL HIGH (ref <20)

## 2013-04-10 ENCOUNTER — Encounter: Payer: Self-pay | Admitting: Family Medicine

## 2013-04-23 ENCOUNTER — Encounter: Payer: Self-pay | Admitting: Internal Medicine

## 2013-04-23 ENCOUNTER — Ambulatory Visit (INDEPENDENT_AMBULATORY_CARE_PROVIDER_SITE_OTHER): Payer: No Typology Code available for payment source | Admitting: Internal Medicine

## 2013-04-23 VITALS — BP 130/87 | HR 106 | Temp 98.3°F | Ht 62.0 in | Wt 130.0 lb

## 2013-04-23 DIAGNOSIS — B181 Chronic viral hepatitis B without delta-agent: Secondary | ICD-10-CM

## 2013-04-24 ENCOUNTER — Telehealth: Payer: Self-pay | Admitting: Family Medicine

## 2013-04-24 LAB — HEPATITIS B SURFACE ANTIGEN: HEP B S AG: POSITIVE — AB

## 2013-04-24 LAB — HEPATITIS A ANTIBODY, TOTAL: Hep A Total Ab: REACTIVE — AB

## 2013-04-24 LAB — PROTIME-INR
INR: 0.99 (ref <1.50)
PROTHROMBIN TIME: 13 s (ref 11.6–15.2)

## 2013-04-24 LAB — HEPATITIS B SURFACE ANTIBODY,QUALITATIVE: HEP B S AB: NEGATIVE

## 2013-04-24 NOTE — Progress Notes (Signed)
Patient ID: Brenda Brady, female   DOB: [birth year], 38 y.o.   MRN: [id number]         [facility]  Reason for Consult: Chronic hepatitis B Referring Physician: Dr. Kerry Hough  Patient Active Problem List   Diagnosis Date Noted  . Chronic active viral hepatitis B 11/05/2012    Priority: High  . Well woman exam with routine gynecological exam 12/01/2012  . Routine health maintenance 11/05/2012  . OVERWEIGHT 02/07/2009    Patient's Medications  New Prescriptions   No medications on file  Previous Medications   TELBIVUDINE (TYZEKA) 600 MG TABLET    Take 1 tablet (600 mg total) by mouth daily.  Modified Medications   No medications on file  Discontinued Medications   BENZONATATE (TESSALON) 100 MG CAPSULE    Take 1 capsule (100 mg total) by mouth 2 (two) times daily as needed for cough.    Recommendations: 1. Check hepatitis B surface antigen, surface antibody, e antigen and e antibody 2. Check antiviral resistance panel for hepatitis B 3. Check hepatitis A antibody 4. 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 I will recheck her e antigen and e antibody and a resistance panel before making a decision about salvage therapy.   HPI: Brenda Brady is a 38 y.o. female from [place] who was diagnosed with active hepatitis B in 2004 during routine testing. She and her family immigrated to the [place] in 2010. She denies ever being sick with yellow jaundice or hepatitis that she is aware of. She states that her mother and father have been tested for hepatitis B and are negative. She states that one sister has hepatitis B. Her husband is negative for hepatitis B. She is not aware of any previous exposure from other female partners. She has never had any transfusions of blood products or tattoos. She recalls being given some  injections for malaria. She has 3 children. A son, age 60, tested positive for hepatitis B as an infant but then later tested negative. Her 42 year old daughter is hepatitis B positive.  She was evaluated by Dr. Earlie Raveling in the spring of 2011. Her liver enzymes were normal. She will surface antigen and e antigen positive with a viral load of greater than 110 million. A liver biopsy showed minimal hepatitis. He recommended treatment with tenofovir. However she became pregnant in May of 2011. I'm not sure she ever received any tenofovir. He recommended that she start telbivudine at the start of her third trimester. Available records do not indicate that a repeat viral load was obtained while on treatment. She delivered a healthy son in late December of 2011. She did not breast-feed. Her son received HBIG and hepatitis B vaccine. The son is hepatitis B negative. She stopped telbivudine in January of 2012. A viral load obtained in March of 2012 was greater than 170 million. She restarted telbivudine in December of 2012 and a viral load obtained in August of 2013 was down to 868.  She tells me (with the aid of her interpreter) that she has been on therapy continuously. Her most recent viral load in October of 2014 or 85,277,824. Her liver enzymes have been intermittently elevated but are normal now. She states that she worries about her hepatitis B but otherwise feels normal.  Review of Systems: Pertinent items are noted in  HPI.    Past Medical History  Diagnosis Date  . Hepatitis B, chronic   . Hepatitis B infection   . Hepatitis B, chronic     History  Substance Use Topics  . Smoking status: Never Smoker   . Smokeless tobacco: Never Used  . Alcohol Use: No    No family history on file. No Known Allergies  OBJECTIVE: Blood pressure 130/87, pulse 106, temperature 98.3 F (36.8 C), temperature source Oral, height 5\' 2"  (1.575 m), weight 130 lb (58.968 kg). General: She is in no  distress Skin: No rash Lymph nodes: No palpable adenopathy Oral: No oropharyngeal lesions Lungs: Clear Cor: Regular S1 and S2 with no murmurs Abdomen: Obese, soft and nontender Joints and extremities: Normal Mood and affect: Normal  Microbiology: No results found for this or any previous visit (from the past 240 hour(s)).  Michel Bickers, MD [facility] [phone] pager   [phone] cell 04/24/2013, 3:07 PM

## 2013-04-24 NOTE — Telephone Encounter (Signed)
Called pt. LMVM to call back. Please tell pt, that we can not refer to the Dental Clinic at this time. They are not taking any patients due to not having volunteers. Thanks. Javier Glazier, Gerrit Heck

## 2013-04-24 NOTE — Telephone Encounter (Signed)
Patient has the orange card now and is needing a referral to the dental clinic.  She has a broken tooth and teeth that have grown in sideways.

## 2013-05-05 LAB — HEPATITIS B VIRUS CORE/PRECORE
BCP Mutations: NOT DETECTED
HBV PreCore Mutation: NOT DETECTED
Polymerase Mutations: DETECTED

## 2013-05-05 LAB — HEPATITIS B E ANTIBODY

## 2013-05-05 LAB — HEPATITIS B E ANTIGEN

## 2013-05-13 ENCOUNTER — Other Ambulatory Visit (INDEPENDENT_AMBULATORY_CARE_PROVIDER_SITE_OTHER): Payer: No Typology Code available for payment source

## 2013-05-13 DIAGNOSIS — B181 Chronic viral hepatitis B without delta-agent: Secondary | ICD-10-CM

## 2013-05-15 LAB — HEPATITIS B E ANTIBODY: HEPATITIS BE ANTIBODY: NONREACTIVE

## 2013-05-15 LAB — HEPATITIS B E ANTIGEN: Hepatitis Be Antigen: REACTIVE — AB

## 2013-05-19 ENCOUNTER — Encounter: Payer: Self-pay | Admitting: Internal Medicine

## 2013-05-19 ENCOUNTER — Other Ambulatory Visit: Payer: Self-pay | Admitting: *Deleted

## 2013-05-19 ENCOUNTER — Ambulatory Visit (INDEPENDENT_AMBULATORY_CARE_PROVIDER_SITE_OTHER): Payer: No Typology Code available for payment source | Admitting: Internal Medicine

## 2013-05-19 ENCOUNTER — Telehealth: Payer: Self-pay | Admitting: *Deleted

## 2013-05-19 VITALS — BP 126/84 | HR 79 | Temp 98.2°F | Ht 59.0 in | Wt 136.5 lb

## 2013-05-19 DIAGNOSIS — B181 Chronic viral hepatitis B without delta-agent: Secondary | ICD-10-CM

## 2013-05-19 MED ORDER — TENOFOVIR DISOPROXIL FUMARATE 300 MG PO TABS: 300.0000 mg | ORAL_TABLET | Freq: Every day | ORAL | Status: DC

## 2013-05-19 NOTE — Telephone Encounter (Signed)
Faxed application for Vire[employer] to [employer] today.

## 2013-05-19 NOTE — Progress Notes (Signed)
         [place] for Infectious Disease  Patient Active Problem List   Diagnosis Date Noted  . Chronic active viral hepatitis B 11/05/2012    Priority: High  . Well woman exam with routine gynecological exam 12/01/2012  . Routine health maintenance 11/05/2012  . OVERWEIGHT 02/07/2009    Patient's Medications  New Prescriptions   TENOFOVIR (VIREAD) 300 MG TABLET    Take 1 tablet (300 mg total) by mouth daily.  Previous Medications   No medications on file  Modified Medications   No medications on file  Discontinued Medications   TELBIVUDINE (TYZEKA) 600 MG TABLET    Take 1 tablet (600 mg total) by mouth daily.    Subjective: Brenda Brady is in with the interpreter and her family friend. She is feeling well. She stopped Tyzeka after her last visit with me since it was not working.  Review of Systems: Pertinent items are noted in HPI.  Past Medical History  Diagnosis Date  . Hepatitis B, chronic   . Hepatitis B infection   . Hepatitis B, chronic     History  Substance Use Topics  . Smoking status: Never Smoker   . Smokeless tobacco: Never Used  . Alcohol Use: No    No family history on file.  No Known Allergies  Objective: Temp: 98.2 F (36.8 C) (04/14 1003) Temp src: Oral (04/14 1003) BP: 126/84 mmHg (04/14 1003) Pulse Rate: 79 (04/14 1003)  General: She is smiling and in good spirits Skin: No rash Lungs: Clear Cor: Regular S1 and S2 no murmurs Abdomen: Soft nontender with no palpable masses   Lab Results CMP     Component Value Date/Time   NA 137 11/05/2012 1443   K 4.0 11/05/2012 1443   CL 102 11/05/2012 1443   CO2 25 11/05/2012 1443   GLUCOSE 74 11/05/2012 1443   BUN 8 11/05/2012 1443   CREATININE 0.74 11/05/2012 1443   CREATININE 0.58 10/27/2009 1859   CALCIUM 9.1 11/05/2012 1443   PROT 8.0 11/05/2012 1443   ALBUMIN 4.3 11/05/2012 1443   AST 17 11/05/2012 1443   ALT 17 11/05/2012 1443   ALKPHOS 51 11/05/2012 1443   BILITOT 0.4 11/05/2012 1443     GFRNONAA >89 01/26/2011 0942   GFRAA >89 01/26/2011 0942   Hepatitis B viral load: 73,532,992  Hepatitis B E antigen positive Hepatitis B E antibody negative  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 I will change her to once daily Viread.   Plan: 1. Start Viread 300 mg daily after patient assistance obtained from the manufacturer 2. Followup in 2 months for repeat lab work including viral load, complete metabolic panel and alpha-fetoprotein    Michel Bickers, MD [facility] for [place] [phone] pager   [phone] cell 05/19/2013, 11:03 AM

## 2013-05-25 ENCOUNTER — Telehealth: Payer: Self-pay | Admitting: *Deleted

## 2013-05-25 NOTE — Telephone Encounter (Signed)
Called Brenda Brady and told her I needed a copy of her 2014 tax returns to send to [place] for her Viread.  She said she understood and would get it to me.

## 2013-06-15 ENCOUNTER — Telehealth: Payer: Self-pay

## 2013-06-15 ENCOUNTER — Other Ambulatory Visit: Payer: Self-pay | Admitting: *Deleted

## 2013-06-15 ENCOUNTER — Telehealth: Payer: Self-pay | Admitting: *Deleted

## 2013-06-15 DIAGNOSIS — B181 Chronic viral hepatitis B without delta-agent: Secondary | ICD-10-CM

## 2013-06-15 MED ORDER — TENOFOVIR DISOPROXIL FUMARATE 300 MG PO TABS: 300.0000 mg | ORAL_TABLET | Freq: Every day | ORAL | Status: DC

## 2013-06-15 NOTE — Telephone Encounter (Signed)
Patient needs printed script for Viread.

## 2013-06-15 NOTE — Telephone Encounter (Signed)
Called [employer] about her status.  She has been approved.  All she needed is a prescription to get her Viread.

## 2013-07-02 ENCOUNTER — Other Ambulatory Visit (INDEPENDENT_AMBULATORY_CARE_PROVIDER_SITE_OTHER): Payer: No Typology Code available for payment source

## 2013-07-02 DIAGNOSIS — B181 Chronic viral hepatitis B without delta-agent: Secondary | ICD-10-CM

## 2013-07-02 LAB — COMPREHENSIVE METABOLIC PANEL
ALT: 35 U/L (ref 0–35)
AST: 22 U/L (ref 0–37)
Albumin: 4 g/dL (ref 3.5–5.2)
Alkaline Phosphatase: 78 U/L (ref 39–117)
BUN: 10 mg/dL (ref 6–23)
CALCIUM: 8.7 mg/dL (ref 8.4–10.5)
CHLORIDE: 104 meq/L (ref 96–112)
CO2: 26 meq/L (ref 19–32)
CREATININE: 0.65 mg/dL (ref 0.50–1.10)
GLUCOSE: 100 mg/dL — AB (ref 70–99)
Potassium: 3.5 mEq/L (ref 3.5–5.3)
Sodium: 137 mEq/L (ref 135–145)
Total Bilirubin: 0.4 mg/dL (ref 0.2–1.2)
Total Protein: 7.2 g/dL (ref 6.0–8.3)

## 2013-07-03 LAB — AFP TUMOR MARKER: AFP-Tumor Marker: 4.6 ng/mL (ref 0.0–8.0)

## 2013-07-06 LAB — HEPATITIS B DNA, ULTRAQUANTITATIVE, PCR
Hepatitis B DNA (Calc): 363593 copies/mL — ABNORMAL HIGH (ref <116)
Hepatitis B DNA: 62473 IU/mL — ABNORMAL HIGH (ref <20)

## 2013-07-21 ENCOUNTER — Ambulatory Visit (INDEPENDENT_AMBULATORY_CARE_PROVIDER_SITE_OTHER): Payer: No Typology Code available for payment source | Admitting: Internal Medicine

## 2013-07-21 ENCOUNTER — Encounter: Payer: Self-pay | Admitting: Internal Medicine

## 2013-07-21 VITALS — BP 116/80 | HR 74 | Temp 97.7°F | Wt 130.5 lb

## 2013-07-21 DIAGNOSIS — B181 Chronic viral hepatitis B without delta-agent: Secondary | ICD-10-CM

## 2013-07-21 NOTE — Progress Notes (Signed)
Patient ID: Brenda Brady, female   DOB: Nov 05, 1975, 38 y.o.   MRN: [id number]         [facility] for Infectious Disease  Patient Active Problem List   Diagnosis Date Noted  . Chronic active viral hepatitis B 11/05/2012    Priority: High  . Well woman exam with routine gynecological exam 12/01/2012  . Routine health maintenance 11/05/2012  . OVERWEIGHT 02/07/2009    Patient's Medications  New Prescriptions   No medications on file  Previous Medications   TENOFOVIR (VIREAD) 300 MG TABLET    Take 1 tablet (300 mg total) by mouth daily.  Modified Medications   No medications on file  Discontinued Medications   No medications on file    Subjective: Brenda Brady is in for her routine followup visit. She was able to get started on Viread on May 11. She's not had any problems tolerating it and does not recall missing any doses.  Review of Systems: Pertinent items are noted in HPI.  Past Medical History  Diagnosis Date  . Hepatitis B, chronic   . Hepatitis B infection   . Hepatitis B, chronic     History  Substance Use Topics  . Smoking status: Never Smoker   . Smokeless tobacco: Never Used  . Alcohol Use: No    No family history on file.  No Known Allergies  Objective: Temp: 97.7 F (36.5 C) (06/16 0956) Temp src: Oral (06/16 0956) BP: 116/80 mmHg (06/16 0956) Pulse Rate: 74 (06/16 0956)  General: She is in good spirits  Lab Results Hepatitis B DNA viral load 07/02/2013: 62,473   Assessment: Her hepatitis B viral load has decreased significantly less than 3 weeks after starting her new regimen.  Plan: 1. Continue Viread 2. Followup after repeat viral load in one month   Brenda Bickers, MD [facility] for [place] [phone] pager   [phone] cell 07/21/2013, 10:17 AM

## 2013-08-18 ENCOUNTER — Other Ambulatory Visit (INDEPENDENT_AMBULATORY_CARE_PROVIDER_SITE_OTHER): Payer: No Typology Code available for payment source

## 2013-08-18 DIAGNOSIS — B181 Chronic viral hepatitis B without delta-agent: Secondary | ICD-10-CM

## 2013-08-21 LAB — HEPATITIS B DNA, ULTRAQUANTITATIVE, PCR
HEPATITIS B DNA: 17645 [IU]/mL — AB (ref <20)
Hepatitis B DNA (Calc): 102694 copies/mL — ABNORMAL HIGH (ref <116)

## 2013-09-01 ENCOUNTER — Ambulatory Visit (INDEPENDENT_AMBULATORY_CARE_PROVIDER_SITE_OTHER): Payer: No Typology Code available for payment source | Admitting: Internal Medicine

## 2013-09-01 ENCOUNTER — Encounter: Payer: Self-pay | Admitting: Internal Medicine

## 2013-09-01 VITALS — BP 123/84 | HR 71 | Temp 98.2°F | Wt 124.2 lb

## 2013-09-01 DIAGNOSIS — B181 Chronic viral hepatitis B without delta-agent: Secondary | ICD-10-CM

## 2013-09-01 NOTE — Progress Notes (Signed)
Patient ID: Brenda Brady, female   DOB: 06/11/1975, 38 y.o.   MRN: [id number]         [facility] for Infectious Disease  Patient Active Problem List   Diagnosis Date Noted  . Chronic active viral hepatitis B 11/05/2012    Priority: High  . Well woman exam with routine gynecological exam 12/01/2012  . Routine health maintenance 11/05/2012  . OVERWEIGHT 02/07/2009    Patient's Medications  New Prescriptions   No medications on file  Previous Medications   TENOFOVIR (VIREAD) 300 MG TABLET    Take 1 tablet (300 mg total) by mouth daily.  Modified Medications   No medications on file  Discontinued Medications   No medications on file    Subjective: Brenda Brady is in for her routine followup visit. She has not missed any doses of Viread. She is feeling well.  Review of Systems: Pertinent items are noted in HPI.  Past Medical History  Diagnosis Date  . Hepatitis B, chronic   . Hepatitis B infection   . Hepatitis B, chronic     History  Substance Use Topics  . Smoking status: Never Smoker   . Smokeless tobacco: Never Used  . Alcohol Use: No    No family history on file.  No Known Allergies  Objective: Temp: 98.2 F (36.8 C) (07/28 0936) Temp src: Oral (07/28 0936) BP: 123/84 mmHg (07/28 0936) Pulse Rate: 71 (07/28 0936)  General: She is in good spirits  Lab Results Hepatitis B DNA viral load 08/18/2013: 17645   Assessment: Her hepatitis B viral load has decreased significantly from over 76,000,000 to just slightly over 17,000 in the first few months of therapy.  Plan: 1. Continue Viread 2. Followup after repeat viral load in 6 weeks   Michel Bickers, MD [facility] for [place] [phone] pager   [phone] cell 09/01/2013, 9:55 AM

## 2013-10-13 ENCOUNTER — Other Ambulatory Visit (INDEPENDENT_AMBULATORY_CARE_PROVIDER_SITE_OTHER): Payer: 59

## 2013-10-13 DIAGNOSIS — B181 Chronic viral hepatitis B without delta-agent: Secondary | ICD-10-CM

## 2013-10-13 LAB — BASIC METABOLIC PANEL
BUN: 10 mg/dL (ref 6–23)
CHLORIDE: 103 meq/L (ref 96–112)
CO2: 27 mEq/L (ref 19–32)
Calcium: 9.2 mg/dL (ref 8.4–10.5)
Creat: 0.68 mg/dL (ref 0.50–1.10)
GLUCOSE: 84 mg/dL (ref 70–99)
POTASSIUM: 4.1 meq/L (ref 3.5–5.3)
SODIUM: 137 meq/L (ref 135–145)

## 2013-10-15 LAB — HEPATITIS B E ANTIBODY: Hepatitis Be Antibody: NONREACTIVE

## 2013-10-15 LAB — HEPATITIS B DNA, ULTRAQUANTITATIVE, PCR
Hepatitis B DNA (Calc): 8922 copies/mL — ABNORMAL HIGH (ref <116)
Hepatitis B DNA: 1533 IU/mL — ABNORMAL HIGH (ref <20)

## 2013-10-15 LAB — HEPATITIS B E ANTIGEN: Hepatitis Be Antigen: REACTIVE — AB

## 2013-10-27 ENCOUNTER — Ambulatory Visit (INDEPENDENT_AMBULATORY_CARE_PROVIDER_SITE_OTHER): Payer: 59 | Admitting: Internal Medicine

## 2013-10-27 ENCOUNTER — Encounter: Payer: Self-pay | Admitting: Internal Medicine

## 2013-10-27 VITALS — BP 116/78 | HR 76 | Temp 97.9°F | Wt 126.0 lb

## 2013-10-27 DIAGNOSIS — B181 Chronic viral hepatitis B without delta-agent: Secondary | ICD-10-CM

## 2013-10-27 DIAGNOSIS — Z23 Encounter for immunization: Secondary | ICD-10-CM

## 2013-10-27 NOTE — Progress Notes (Signed)
Patient ID: Brenda Brady, female   DOB: 1975-05-28, 38 y.o.   MRN: [id number]         [facility] for Infectious Disease  Patient Active Problem List   Diagnosis Date Noted  . Chronic active viral hepatitis B 11/05/2012    Priority: High  . Well woman exam with routine gynecological exam 12/01/2012  . Routine health maintenance 11/05/2012  . OVERWEIGHT 02/07/2009    Patient's Medications  New Prescriptions   No medications on file  Previous Medications   TENOFOVIR (VIREAD) 300 MG TABLET    Take 1 tablet (300 mg total) by mouth daily.  Modified Medications   No medications on file  Discontinued Medications   No medications on file    Subjective: Brenda Brady is in for her routine visit. She denies having any problems tolerating or taking her Viread. She denies missing any doses. She says that she is feeling better with less pain. She has been eating less and trying to lose weight. Review of Systems: Pertinent items are noted in HPI.  Past Medical History  Diagnosis Date  . Hepatitis B, chronic   . Hepatitis B infection   . Hepatitis B, chronic     History  Substance Use Topics  . Smoking status: Never Smoker   . Smokeless tobacco: Never Used  . Alcohol Use: No    No family history on file.  No Known Allergies  Objective: Temp: 97.9 F (36.6 C) (09/22 0916) Temp src: Oral (09/22 0916) BP: 116/78 mmHg (09/22 0916) Pulse Rate: 76 (09/22 0916)  General: She is smiling and in good spirits. Her BMI is down to just over 25 Abdomen: Soft and nontender with no palpable masses  Lab Results  Component Value Date   ALT 35 07/02/2013   AST 22 07/02/2013   ALKPHOS 78 07/02/2013   BILITOT 0.4 07/02/2013   Hepatitis B viral load 10/13/2013: 1533   Assessment: Her hepatitis B has come under much better control. Her viral load has decreased from over 76 million to 1533.  Plan: 1. Continue Viread 2. Followup after blood work in 6 months   Michel Bickers, MD [facility]  for [place] [phone] pager   [phone] cell 10/27/2013, 9:33 AM

## 2014-04-13 ENCOUNTER — Other Ambulatory Visit: Payer: 59

## 2014-04-13 DIAGNOSIS — B181 Chronic viral hepatitis B without delta-agent: Secondary | ICD-10-CM

## 2014-04-13 LAB — COMPREHENSIVE METABOLIC PANEL
ALK PHOS: 64 U/L (ref 39–117)
ALT: 29 U/L (ref 0–35)
AST: 20 U/L (ref 0–37)
Albumin: 4.1 g/dL (ref 3.5–5.2)
BUN: 7 mg/dL (ref 6–23)
CO2: 25 mEq/L (ref 19–32)
CREATININE: 0.68 mg/dL (ref 0.50–1.10)
Calcium: 9 mg/dL (ref 8.4–10.5)
Chloride: 103 mEq/L (ref 96–112)
Glucose, Bld: 94 mg/dL (ref 70–99)
POTASSIUM: 3.9 meq/L (ref 3.5–5.3)
Sodium: 138 mEq/L (ref 135–145)
Total Bilirubin: 0.6 mg/dL (ref 0.2–1.2)
Total Protein: 8 g/dL (ref 6.0–8.3)

## 2014-04-20 LAB — HEPATITIS B DNA, ULTRAQUANTITATIVE, PCR
HEPATITIS B DNA: 46 [IU]/mL — AB (ref <20)
Hepatitis B DNA (Calc): 268 copies/mL — ABNORMAL HIGH (ref <116)

## 2014-04-27 ENCOUNTER — Ambulatory Visit (INDEPENDENT_AMBULATORY_CARE_PROVIDER_SITE_OTHER): Payer: 59 | Admitting: Internal Medicine

## 2014-04-27 ENCOUNTER — Other Ambulatory Visit: Payer: Self-pay | Admitting: Internal Medicine

## 2014-04-27 ENCOUNTER — Encounter: Payer: Self-pay | Admitting: Internal Medicine

## 2014-04-27 VITALS — BP 118/83 | HR 77 | Temp 97.6°F | Wt 130.8 lb

## 2014-04-27 DIAGNOSIS — B181 Chronic viral hepatitis B without delta-agent: Secondary | ICD-10-CM

## 2014-04-27 MED ORDER — TENOFOVIR DISOPROXIL FUMARATE 300 MG PO TABS: 300.0000 mg | ORAL_TABLET | Freq: Every day | ORAL | Status: DC

## 2014-04-27 NOTE — Progress Notes (Signed)
Patient ID: Brenda Brady, female   DOB: 08/03/1975, 39 y.o.   MRN: [id number]         [facility] for Infectious Disease  Patient Active Problem List   Diagnosis Date Noted  . Chronic active viral hepatitis B 11/05/2012    Priority: High  . Well woman exam with routine gynecological exam 12/01/2012  . Routine health maintenance 11/05/2012  . OVERWEIGHT 02/07/2009    Patient's Medications  New Prescriptions   No medications on file  Previous Medications   No medications on file  Modified Medications   Modified Medication Previous Medication   TENOFOVIR (VIREAD) 300 MG TABLET tenofovir (VIREAD) 300 MG tablet      Take 1 tablet (300 mg total) by mouth daily.    Take 1 tablet (300 mg total) by mouth daily.  Discontinued Medications   No medications on file    Subjective: Brenda Brady is in for her routine visit. She denies having any problems tolerating or taking her Viread. She denies missing any doses. She is using a weekly pillbox.  Review of Systems: Pertinent items are noted in HPI.  Past Medical History  Diagnosis Date  . Hepatitis B, chronic   . Hepatitis B infection   . Hepatitis B, chronic     History  Substance Use Topics  . Smoking status: Never Smoker   . Smokeless tobacco: Never Used  . Alcohol Use: No    No family history on file.  No Known Allergies  Objective: Temp: 97.6 F (36.4 C) (03/22 0923) Temp Source: Oral (03/22 0923) BP: 118/83 mmHg (03/22 0923) Pulse Rate: 77 (03/22 0923)  General: She is smiling and in good spirits. Her weight is up 4 pounds to 130. Her BMI is 26.39 Abdomen: Soft and nontender with no palpable masses  Lab Results  Component Value Date   ALT 29 04/13/2014   AST 20 04/13/2014   ALKPHOS 64 04/13/2014   BILITOT 0.6 04/13/2014   Hepatitis B viral load 10/13/2013: 46   Assessment: Her hepatitis B has come under much better control.   Plan: 1. Continue Viread 2. Followup after blood work in 6 months   Michel Bickers, MD [facility] for [place] [phone] pager   [phone] cell 04/27/2014, 9:42 AM

## 2014-05-13 ENCOUNTER — Other Ambulatory Visit: Payer: Self-pay | Admitting: Internal Medicine

## 2014-06-14 ENCOUNTER — Other Ambulatory Visit: Payer: Self-pay | Admitting: *Deleted

## 2014-06-14 DIAGNOSIS — B181 Chronic viral hepatitis B without delta-agent: Secondary | ICD-10-CM

## 2014-06-14 MED ORDER — TENOFOVIR DISOPROXIL FUMARATE 300 MG PO TABS: 300.0000 mg | ORAL_TABLET | Freq: Every day | ORAL | Status: DC

## 2014-10-12 IMAGING — US US ABDOMEN COMPLETE
1 series · 14 of 25 positions shown · non-contrast
Comparison: None.

CLINICAL DATA: Hepatitis B.

EXAM:
ULTRASOUND ABDOMEN COMPLETE

[Series 1: us abdomen complete · 0.21mm/px · 14 of 83 slices shown]
[im 1/83]
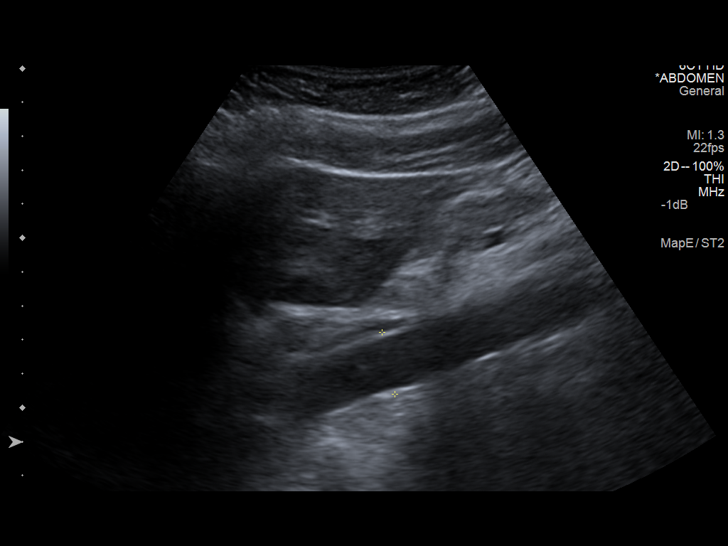
[im 7/83]
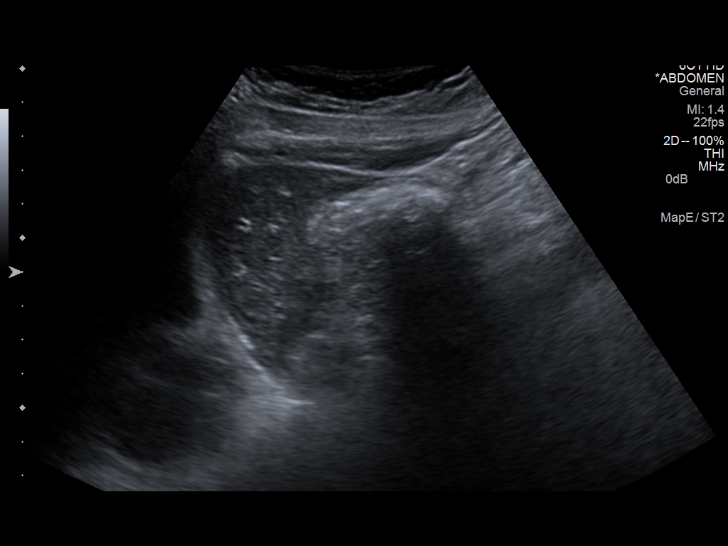
[im 14/83]
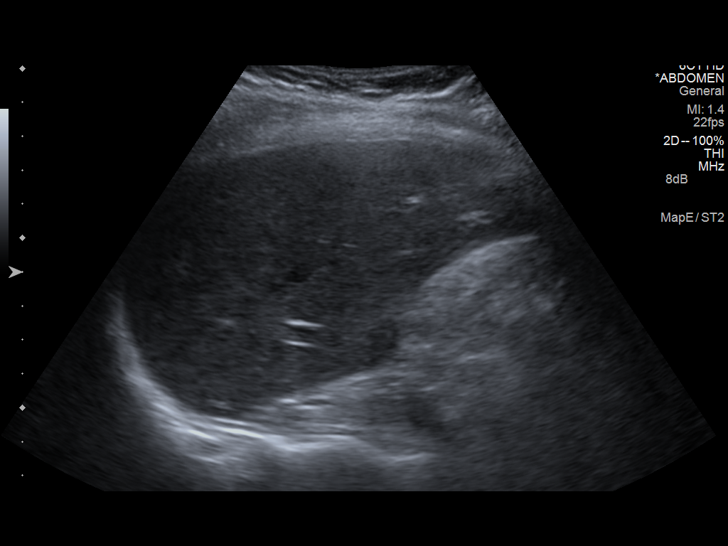
[im 21/83]
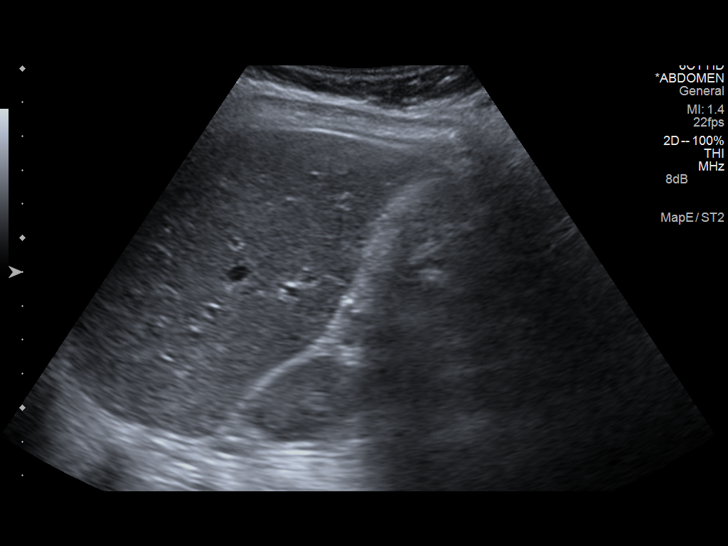
[im 28/83]
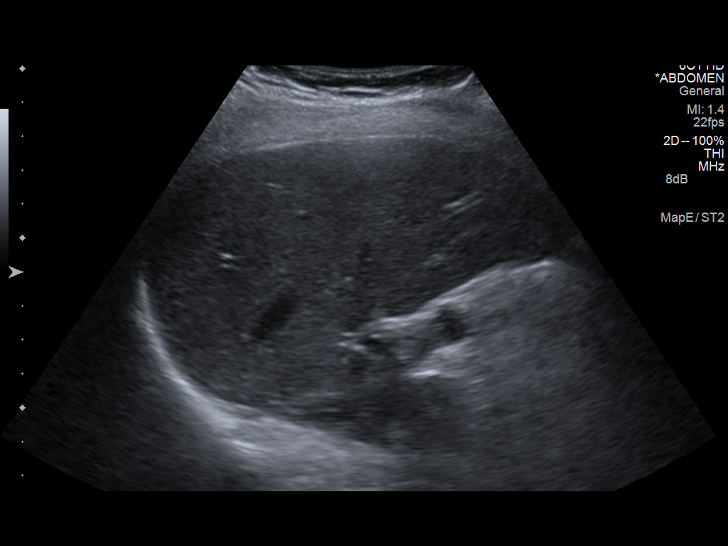
[im 31/83]
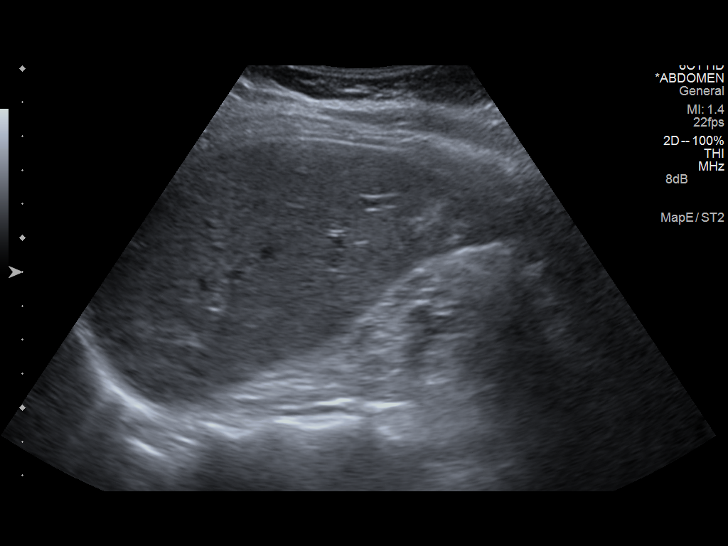
[im 38/83]
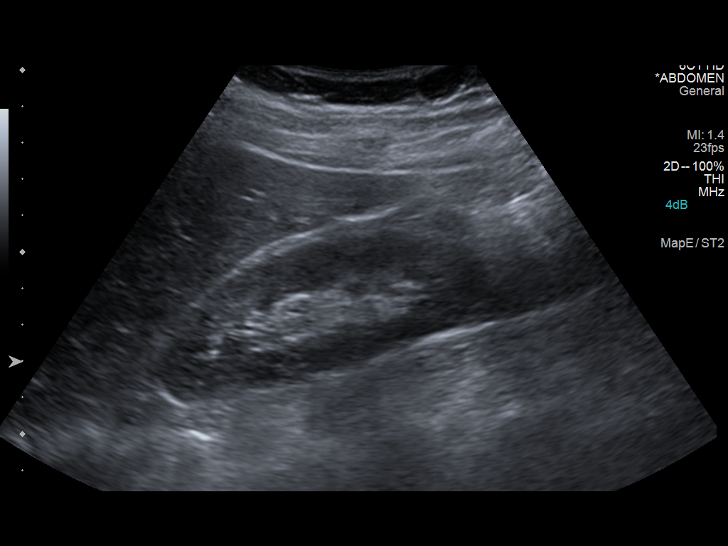
[im 45/83]
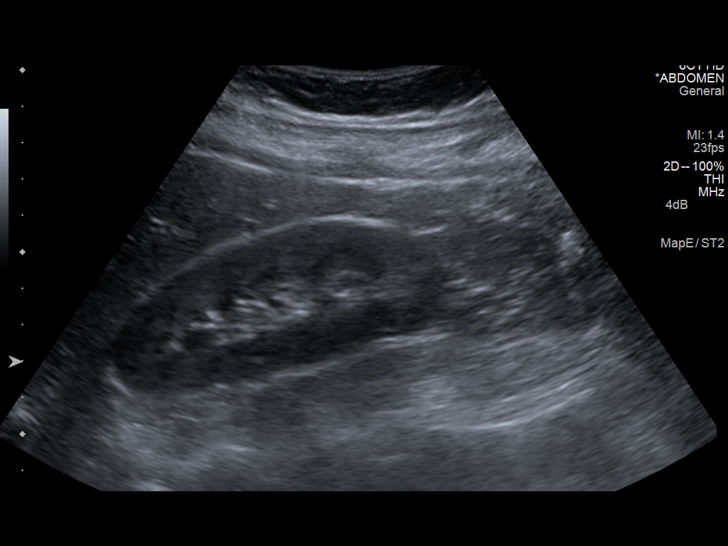
[im 52/83]
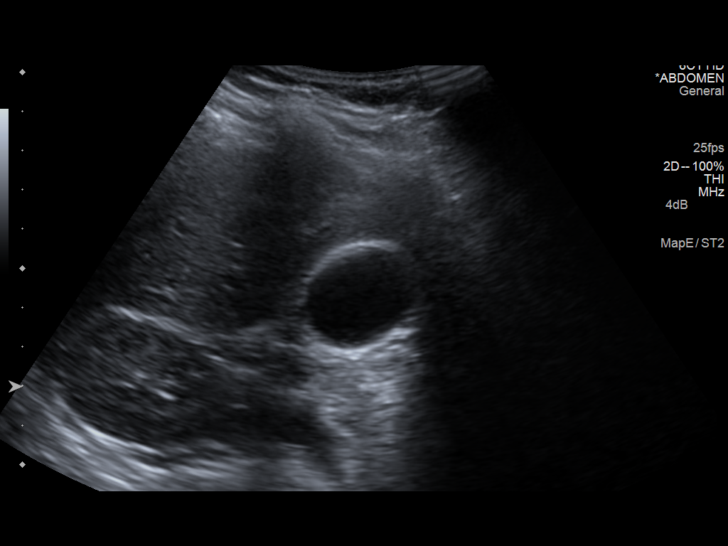
[im 55/83]
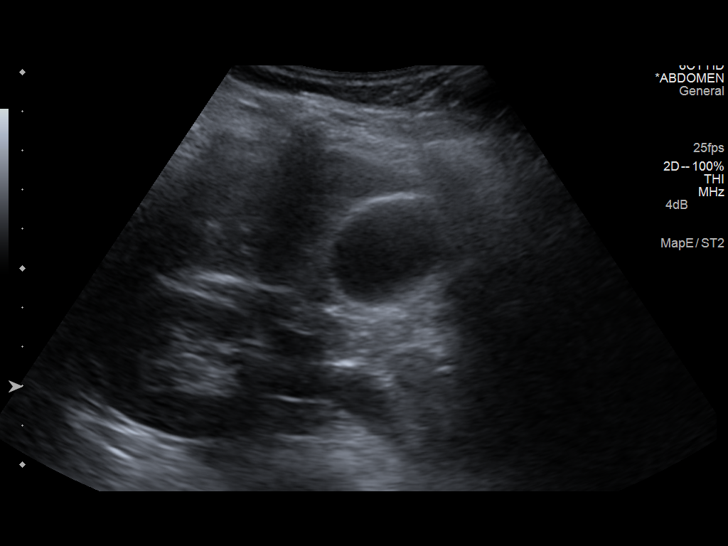
[im 62/83]
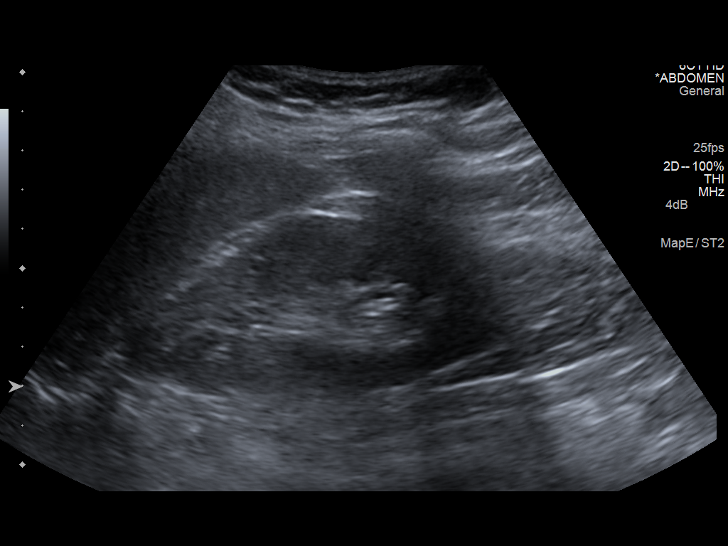
[im 69/83]
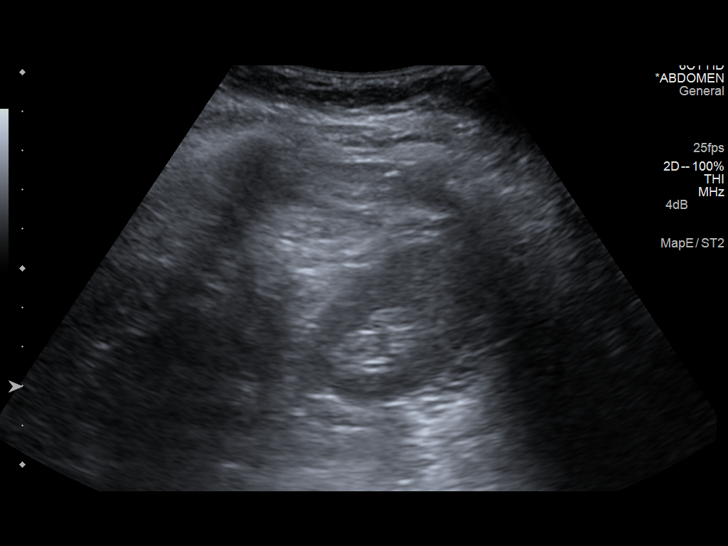
[im 76/83]
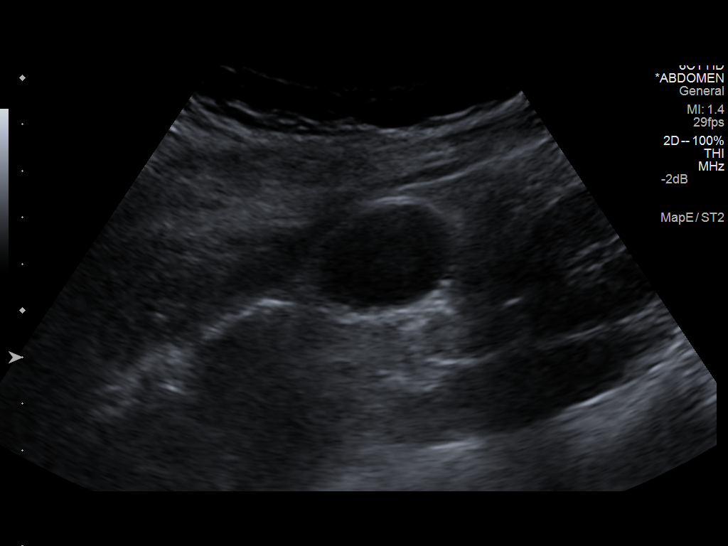
[im 83/83]
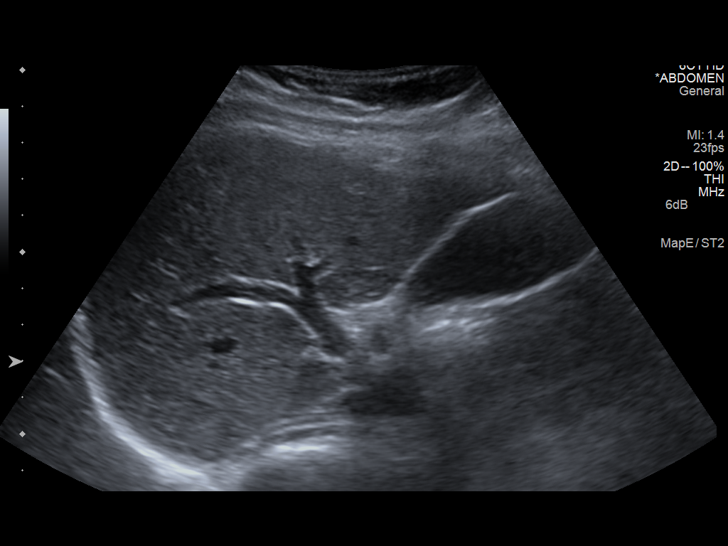

[14 of 25 positions shown; findings below may reference images not displayed]

FINDINGS: Gallbladder

No gallstones or wall thickening visualized. No sonographic Murphy
sign noted.

Common bile duct

Diameter: Normal caliber, 5 mm.

Liver

No focal lesion identified. Within normal limits in parenchymal
echogenicity.

IVC

No abnormality visualized.

Pancreas

Visualized portion unremarkable.

Spleen

Size and appearance within normal limits.

Right Kidney

Length: 9.6 cm. Echogenicity within normal limits. No mass or
hydronephrosis visualized.

Left Kidney

Length: 10.2 cm. Echogenicity within normal limits. No mass or
hydronephrosis visualized.

Abdominal aorta

No aneurysm visualized.
IMPRESSION: Unremarkable study.

## 2014-10-28 ENCOUNTER — Other Ambulatory Visit: Payer: 59

## 2014-10-28 DIAGNOSIS — B181 Chronic viral hepatitis B without delta-agent: Secondary | ICD-10-CM

## 2014-10-29 LAB — COMPREHENSIVE METABOLIC PANEL
ALK PHOS: 59 U/L (ref 33–115)
ALT: 26 U/L (ref 6–29)
AST: 19 U/L (ref 10–30)
Albumin: 4.3 g/dL (ref 3.6–5.1)
BUN: 8 mg/dL (ref 7–25)
CHLORIDE: 104 mmol/L (ref 98–110)
CO2: 25 mmol/L (ref 20–31)
Calcium: 8.9 mg/dL (ref 8.6–10.2)
Creat: 0.7 mg/dL (ref 0.50–1.10)
Glucose, Bld: 78 mg/dL (ref 65–99)
POTASSIUM: 4.5 mmol/L (ref 3.5–5.3)
Sodium: 137 mmol/L (ref 135–146)
Total Bilirubin: 0.6 mg/dL (ref 0.2–1.2)
Total Protein: 8.1 g/dL (ref 6.1–8.1)

## 2014-11-01 LAB — HEPATITIS B E ANTIBODY: HEPATITIS BE ANTIBODY: NONREACTIVE

## 2014-11-01 LAB — HEPATITIS B DNA, ULTRAQUANTITATIVE, PCR
Hepatitis B DNA (Calc): 279 copies/mL — ABNORMAL HIGH (ref <116)
Hepatitis B DNA: 48 IU/mL — ABNORMAL HIGH (ref <20)

## 2014-11-01 LAB — HEPATITIS B E ANTIGEN: HEPATITIS BE ANTIGEN: REACTIVE — AB

## 2014-11-04 ENCOUNTER — Ambulatory Visit (INDEPENDENT_AMBULATORY_CARE_PROVIDER_SITE_OTHER): Payer: 59 | Admitting: Internal Medicine

## 2014-11-04 ENCOUNTER — Encounter: Payer: Self-pay | Admitting: Internal Medicine

## 2014-11-04 VITALS — BP 128/87 | HR 69 | Temp 98.0°F | Wt 123.5 lb

## 2014-11-04 DIAGNOSIS — B181 Chronic viral hepatitis B without delta-agent: Secondary | ICD-10-CM

## 2014-11-04 DIAGNOSIS — Z23 Encounter for immunization: Secondary | ICD-10-CM

## 2014-11-04 NOTE — Progress Notes (Signed)
Patient ID: Brenda Brady, female   DOB: 1975/04/03, 39 y.o.   MRN: [id number]         [facility] for Infectious Disease  Patient Active Problem List   Diagnosis Date Noted  . Chronic active viral hepatitis B 11/05/2012    Priority: High  . Well woman exam with routine gynecological exam 12/01/2012  . Routine health maintenance 11/05/2012  . OVERWEIGHT 02/07/2009    Patient's Medications  New Prescriptions   No medications on file  Previous Medications   TENOFOVIR (VIREAD) 300 MG TABLET    Take 1 tablet (300 mg total) by mouth daily.  Modified Medications   No medications on file  Discontinued Medications   No medications on file    Subjective: Brenda Brady is in for her routine follow-up for chronic hepatitis B with her husband and daughter. She has occasional mild a.m. headaches but otherwise is feeling well. She says that she has no current concerns about her health. She has no nausea, vomiting or abdominal pain.  Review of Systems: Pertinent items are noted in HPI.  Past Medical History  Diagnosis Date  . Hepatitis B, chronic   . Hepatitis B infection   . Hepatitis B, chronic     Social History  Substance Use Topics  . Smoking status: Never Smoker   . Smokeless tobacco: Never Used  . Alcohol Use: No    No family history on file.  No Known Allergies  Objective: Filed Vitals:   11/04/14 1006  BP: 128/87  Pulse: 69  Temp: 98 F (36.7 C)  TempSrc: Oral  Weight: 123 lb 8 oz (56.019 kg)   Body mass index is 24.93 kg/(m^2).  General: She is smiling and in good spirits Skin: No jaundice or rash Lungs: Clear Cor: Regular S1 and S2 with no murmurs Abdomen: Soft and nontender. I cannot palpate her liver or spleen or any other masses   Lab Results CMP     Component Value Date/Time   NA 137 10/28/2014 0958   K 4.5 10/28/2014 0958   CL 104 10/28/2014 0958   CO2 25 10/28/2014 0958   GLUCOSE 78 10/28/2014 0958   BUN 8 10/28/2014 0958   CREATININE 0.70  10/28/2014 0958   CREATININE 0.58 10/27/2009 1859   CALCIUM 8.9 10/28/2014 0958   PROT 8.1 10/28/2014 0958   ALBUMIN 4.3 10/28/2014 0958   AST 19 10/28/2014 0958   ALT 26 10/28/2014 0958   ALKPHOS 59 10/28/2014 0958   BILITOT 0.6 10/28/2014 0958   GFRNONAA >89 01/26/2011 0942   GFRAA >89 01/26/2011 0942   Hepatitis B E antigen 10/28/2014: Positive Hepatitis B antibody 10/28/2014: Negative Hepatitis B DNA 10/28/2014: 48   Problem List Items Addressed This Visit      High   Chronic active viral hepatitis B    She has chronic, stable hepatitis B. She has had no evidence of cirrhosis clinically or by ultrasound 2 years ago. I will update her ultrasound with elastography. She has very low viral loads and normal liver enzymes. She does not need treatment at this time. She will follow-up me after ultrasound with elastography and repeat labs in 6 months.      Relevant Orders   Hepatitis B DNA, ultraquantitative, PCR   Hepatitis B e antibody   Hepatitis B e antigen   Comprehensive metabolic panel   US ABDOMEN COMPLETE [place], [place] for [facility] [phone]  pager   [phone] cell 11/04/2014, 10:30 AM

## 2014-11-04 NOTE — Assessment & Plan Note (Signed)
She has chronic, stable hepatitis B. She has had no evidence of cirrhosis clinically or by ultrasound 2 years ago. I will update her ultrasound with elastography. She has very low viral loads and normal liver enzymes. She does not need treatment at this time. She will follow-up me after ultrasound with elastography and repeat labs in 6 months.

## 2014-11-30 ENCOUNTER — Ambulatory Visit (HOSPITAL_COMMUNITY)
Admission: RE | Admit: 2014-11-30 | Discharge: 2014-11-30 | Disposition: A | Discharge status: Home / Self Care | Payer: 59 | Source: Ambulatory Visit | Attending: Internal Medicine | Admitting: Internal Medicine

## 2014-11-30 DIAGNOSIS — B181 Chronic viral hepatitis B without delta-agent: Secondary | ICD-10-CM | POA: Insufficient documentation

## 2015-02-08 MED FILL — *VIREAD 300 MG TABLET: 300 | 30 days supply | Qty: 30 | Fill #8

## 2015-03-09 MED FILL — VIREAD 300 MG TABLET: 300 | 30 days supply | Qty: 30 | Fill #9

## 2015-03-28 ENCOUNTER — Encounter: Payer: Self-pay | Admitting: Student

## 2015-03-28 ENCOUNTER — Ambulatory Visit (INDEPENDENT_AMBULATORY_CARE_PROVIDER_SITE_OTHER): Payer: 59 | Admitting: Student

## 2015-03-28 VITALS — BP 107/81 | HR 87 | Temp 98.5°F | Ht 59.0 in | Wt 119.5 lb

## 2015-03-28 DIAGNOSIS — Z30433 Encounter for removal and reinsertion of intrauterine contraceptive device: Secondary | ICD-10-CM | POA: Diagnosis not present

## 2015-03-28 MED ORDER — LEVONORGESTREL 20 MCG/24HR IU IUD
INTRAUTERINE_SYSTEM | Freq: Once | INTRAUTERINE | Status: AC
  Administered 2015-03-28: 1 via INTRAUTERINE

## 2015-03-28 NOTE — Progress Notes (Signed)
Subjective:    Patient ID: Brenda Brady, female    DOB: 09/26/1975, 40 y.o.   MRN: [id number]  Interpretor in person: Adah Perl  HPI Patient presented here with live interpretor for IUD (Mirena) removal. She had this placed 5 years ago here (03/2010). She denies having issues with this except for prolonged amenorrhea. She is wondering if this is normal. She hasn't thought what to use after removal of the IUD. She says her husband is in waiting room and would like to discuss with him. After counseling with husband involved, they decided to continue meirena as she hasn't had problem with it.  Denies any fever, abnormal vagina discharge, bleeding,  low abdominal pain, history of PID or abnormal pap smear. Her last pap smear was in 11/2012 and was normal.  ROS  Per HPI  PMH: history of chronic hepatitis B. She is on Tenofovir for this. She is followed by ID  Objective:   Physical Exam Filed Vitals:   03/28/15 1035  BP: 107/81  Pulse: 87  Temp: 98.5 F (36.9 C)  TempSrc: Oral  Height: 4\' 11"  (1.499 m)  Weight: 119 lb 8 oz (54.205 kg)   Gen: appears well CV: regular rate and rythm. S1 & S2 audible Resp: no apparent work of breathing GI: bowel sounds normal, no tenderness to palpation GU: no suprapubic tenderness, external genitalia appears normal Speculum exam: nabothian cyst, mirena string visualized, no bleeding or abnormal discharge, no vaginal wall lesion Neuro: alert and oriented, no gross deficit    Assessment & Plan:  Encounter for removal and reinsertion of IUD Patient here for removal and reinsertion of IUD (mirena). IUD placed 5 years ago. No complication. Medical history reviewed and no contraindication. Contraception counseling provided on available options. Patient and partner chose to continue with mirena given no complication or issues with prior mirena. Written consent was obtained. Removal and insertion were performed as in procedure note with supervision of Dr. Gwendlyn Deutscher.  Patient tolerated the procedure well. Expected minor side effects and possible major complication of the procedure and return precautions were revised in after visit summary again. Patient will return to clinic to have the string checked.    PROCEDURE NOTE: IUD removal Patient given informed consent for IUD removal and reinsertion. She was informed that there is a chance we may not able to insert a new one after taking the old one out and she could loss the birth control protection benefit. Discussed risks of irregular bleeding, cramping, infection, malpositioning or misplacement of the IUD outside the uterus which may require further procedures.Informed consent given and signed copy in the chart. Appropriate time out taken. Patient was placed in the lithotomy position and the cervix brought into view using speculum. Cervix visualized. No bleeding or abnormal discharge noted except for nabothian cysts. Only one of the two IUD strings was identified coming from the cervical os. These strings were grasped with ring forceps, and the IUD withdrawn gently from the uterus. There were no complications and no blood loss. Patient tolerated the procedure well. The two strings were present.  IUD Insertion  With speculum in place placed in the vagina.  Cleaned the cervix and vaginal wall with Betadine x 3.  Grasped anteriorly with a single tooth tenaculum. Uterus sounded to 7 cm.  Mirena IUD placed per manufacturer's recommendations.  Strings trimmed to 3 cm. Tenaculum was removed, good hemostasis noted.  Patient tolerated procedure well.   Patient was given post-procedure instructions.  Patient was also  asked to check IUD strings periodically and follow up in 2 weeks for IUD check. Recommended Ibuprofen 600 mg three times a day as needed for cramping.

## 2015-03-28 NOTE — Addendum Note (Signed)
Addended by: Christen Bame D on: 03/28/2015 05:33 PM   Modules accepted: Orders

## 2015-03-28 NOTE — Assessment & Plan Note (Signed)
Patient here for removal and reinsertion of IUD (mirena). IUD placed 5 years ago. No complication. Medical history reviewed and no contraindication. Contraception counseling provided on available options. Patient and partner chose to continue with mirena given no complication or issues with prior mirena. Written consent was obtained. Removal and insertion were performed as in procedure note with supervision of Dr. Gwendlyn Deutscher. Patient tolerated the procedure well. Expected minor side effects and possible major complication of the procedure and return precautions were revised in after visit summary again. Patient will return to clinic to have the string checked.

## 2015-03-28 NOTE — Patient Instructions (Addendum)
It was great seeing you today! We have addressed the following issues today  1. Birth control: we have taken out the previous IUD (merina) and replaced a new one. You may note some cramping and bleeding which is normal. However, if you note excessive bleeding, severe cramping, fever or foul smelling discharge please give [name] a call. I suggest you come back in a week or two to have the strings checked to make sure that the IUD is in place. You can take some ibuprofen 600 mg up to three times a day as needed for pain. This is available over the counter. The pharmacist can help you pick the right one. Please, read the information below for more information about the mirena IUD   If we did any lab work today, and the results require attention, either me or my nurse will get in touch with you. If everything is normal, you will get a letter in mail. If you don't hear from [name] in two weeks, please give [name] a call. Otherwise, I look forward to talking with you again at our next visit. If you have any questions or concerns before then, please call the clinic at [phone].  Please bring all your medications to every doctors visit   Sign up for My Chart to have easy access to your labs results, and communication with your Primary care physician.    Please check-out at the front desk before leaving the clinic.   Take Care,    Levonorgestrel intrauterine device (IUD) What is this medicine? LEVONORGESTREL IUD (LEE voe nor jes trel) is a contraceptive (birth control) device. The device is placed inside the uterus by a healthcare professional. It is used to prevent pregnancy and can also be used to treat heavy bleeding that occurs during your period. Depending on the device, it can be used for 3 to 5 years. This medicine may be used for other purposes; ask your health care provider or pharmacist if you have questions. What should I tell my health care provider before I take this medicine? They need to know  if you have any of these conditions: -abnormal Pap smear -cancer of the breast, uterus, or cervix -diabetes -endometritis -genital or pelvic infection now or in the past -have more than one sexual partner or your partner has more than one partner -heart disease -history of an ectopic or tubal pregnancy -immune system problems -IUD in place -liver disease or tumor -problems with blood clots or take blood-thinners -use intravenous drugs -uterus of unusual shape -vaginal bleeding that has not been explained -an unusual or allergic reaction to levonorgestrel, other hormones, silicone, or polyethylene, medicines, foods, dyes, or preservatives -pregnant or trying to get pregnant -breast-feeding How should I use this medicine? This device is placed inside the uterus by a health care professional. Talk to your pediatrician regarding the use of this medicine in children. Special care may be needed. Overdosage: If you think you have taken too much of this medicine contact a poison control center or emergency room at once. NOTE: This medicine is only for you. Do not share this medicine with others. What if I miss a dose? This does not apply. What may interact with this medicine? Do not take this medicine with any of the following medications: -amprenavir -bosentan -fosamprenavir This medicine may also interact with the following medications: -aprepitant -barbiturate medicines for inducing sleep or treating seizures -bexarotene -griseofulvin -medicines to treat seizures like carbamazepine, ethotoin, felbamate, oxcarbazepine, phenytoin, topiramate -modafinil -pioglitazone -rifabutin -  rifampin -rifapentine -some medicines to treat HIV infection like atazanavir, indinavir, lopinavir, nelfinavir, tipranavir, ritonavir -St. John's wort -warfarin This list may not describe all possible interactions. Give your health care provider a list of all the medicines, herbs, non-prescription drugs,  or dietary supplements you use. Also tell them if you smoke, drink alcohol, or use illegal drugs. Some items may interact with your medicine. What should I watch for while using this medicine? Visit your doctor or health care professional for regular check ups. See your doctor if you or your partner has sexual contact with others, becomes HIV positive, or gets a sexual transmitted disease. This product does not protect you against HIV infection (AIDS) or other sexually transmitted diseases. You can check the placement of the IUD yourself by reaching up to the top of your vagina with clean fingers to feel the threads. Do not pull on the threads. It is a good habit to check placement after each menstrual period. Call your doctor right away if you feel more of the IUD than just the threads or if you cannot feel the threads at all. The IUD may come out by itself. You may become pregnant if the device comes out. If you notice that the IUD has come out use a backup birth control method like condoms and call your health care provider. Using tampons will not change the position of the IUD and are okay to use during your period. What side effects may I notice from receiving this medicine? Side effects that you should report to your doctor or health care professional as soon as possible: -allergic reactions like skin rash, itching or hives, swelling of the face, lips, or tongue -fever, flu-like symptoms -genital sores -high blood pressure -no menstrual period for 6 weeks during use -pain, swelling, warmth in the leg -pelvic pain or tenderness -severe or sudden headache -signs of pregnancy -stomach cramping -sudden shortness of breath -trouble with balance, talking, or walking -unusual vaginal bleeding, discharge -yellowing of the eyes or skin Side effects that usually do not require medical attention (report to your doctor or health care professional if they continue or are bothersome): -acne -breast  pain -change in sex drive or performance -changes in weight -cramping, dizziness, or faintness while the device is being inserted -headache -irregular menstrual bleeding within first 3 to 6 months of use -nausea This list may not describe all possible side effects. Call your doctor for medical advice about side effects. You may report side effects to FDA at 1-800-FDA-1088. Where should I keep my medicine? This does not apply. NOTE: This sheet is a summary. It may not cover all possible information. If you have questions about this medicine, talk to your doctor, pharmacist, or health care provider.    2016, Elsevier/Gold Standard. (2011-02-22 13:54:04)

## 2015-04-07 MED FILL — VIREAD 300 MG TABLET: 300 | 30 days supply | Qty: 30 | Fill #10

## 2015-04-11 ENCOUNTER — Encounter: Payer: Self-pay | Admitting: Student

## 2015-04-11 ENCOUNTER — Ambulatory Visit (INDEPENDENT_AMBULATORY_CARE_PROVIDER_SITE_OTHER): Payer: 59 | Admitting: Student

## 2015-04-11 VITALS — BP 113/74 | HR 69 | Temp 98.3°F | Wt 121.0 lb

## 2015-04-11 DIAGNOSIS — Z30431 Encounter for routine checking of intrauterine contraceptive device: Secondary | ICD-10-CM | POA: Diagnosis not present

## 2015-04-11 NOTE — Progress Notes (Signed)
   Subjective:    Patient ID: Brenda Brady, female    DOB: 1975/03/02, 40 y.o.   MRN: [id number]  HPI #IUD check: patient here for IUD string check. IUD placed about two weeks ago. Denies fever, foul-smelling vaginal discharge, abdominal pain or vaginal bleeding.  ROS  Per HPI Objective:   Physical Exam Filed Vitals:   04/11/15 1442  BP: 113/74  Pulse: 69  Temp: 98.3 F (36.8 C)  TempSrc: Oral  Weight: 54.885 kg (121 lb)   Gen: appears well, no acute distress  GU: no suprapubic tenderness Speculum: IUD string visualized, no active bleeding or discharge, nabothian cyst on cervix Skin: no lesion Neuro: alert and oriented, no gross deficit    Assessment & Plan:  IUD check up IUD strings visualized. No sign of infection or bleeding.

## 2015-04-11 NOTE — Assessment & Plan Note (Signed)
IUD strings visualized. No sign of infection or bleeding.

## 2015-04-11 NOTE — Patient Instructions (Signed)
It was great seeing you today! We have addressed the following issues today  1. IUD check: the IUD is at the right place. It should be good for 5 years from the day it is placed.   If we did any lab work today, and the results require attention, either me or my nurse will get in touch with you. If everything is normal, you will get a letter in mail. If you don't hear from [name] in two weeks, please give [name] a call. Otherwise, I look forward to talking with you again at our next visit. If you have any questions or concerns before then, please call the clinic at [phone].  Please bring all your medications to every doctors visit   Sign up for My Chart to have easy access to your labs results, and communication with your Primary care physician.    Please check-out at the front desk before leaving the clinic.   Take Care,   Levonorgestrel intrauterine device (IUD) What is this medicine? LEVONORGESTREL IUD (LEE voe nor jes trel) is a contraceptive (birth control) device. The device is placed inside the uterus by a healthcare professional. It is used to prevent pregnancy and can also be used to treat heavy bleeding that occurs during your period. Depending on the device, it can be used for 3 to 5 years. This medicine may be used for other purposes; ask your health care provider or pharmacist if you have questions. What should I tell my health care provider before I take this medicine? They need to know if you have any of these conditions: -abnormal Pap smear -cancer of the breast, uterus, or cervix -diabetes -endometritis -genital or pelvic infection now or in the past -have more than one sexual partner or your partner has more than one partner -heart disease -history of an ectopic or tubal pregnancy -immune system problems -IUD in place -liver disease or tumor -problems with blood clots or take blood-thinners -use intravenous drugs -uterus of unusual shape -vaginal bleeding that has  not been explained -an unusual or allergic reaction to levonorgestrel, other hormones, silicone, or polyethylene, medicines, foods, dyes, or preservatives -pregnant or trying to get pregnant -breast-feeding How should I use this medicine? This device is placed inside the uterus by a health care professional. Talk to your pediatrician regarding the use of this medicine in children. Special care may be needed. Overdosage: If you think you have taken too much of this medicine contact a poison control center or emergency room at once. NOTE: This medicine is only for you. Do not share this medicine with others. What if I miss a dose? This does not apply. What may interact with this medicine? Do not take this medicine with any of the following medications: -amprenavir -bosentan -fosamprenavir This medicine may also interact with the following medications: -aprepitant -barbiturate medicines for inducing sleep or treating seizures -bexarotene -griseofulvin -medicines to treat seizures like carbamazepine, ethotoin, felbamate, oxcarbazepine, phenytoin, topiramate -modafinil -pioglitazone -rifabutin -rifampin -rifapentine -some medicines to treat HIV infection like atazanavir, indinavir, lopinavir, nelfinavir, tipranavir, ritonavir -St. John's wort -warfarin This list may not describe all possible interactions. Give your health care provider a list of all the medicines, herbs, non-prescription drugs, or dietary supplements you use. Also tell them if you smoke, drink alcohol, or use illegal drugs. Some items may interact with your medicine. What should I watch for while using this medicine? Visit your doctor or health care professional for regular check ups. See your doctor if you  or your partner has sexual contact with others, becomes HIV positive, or gets a sexual transmitted disease. This product does not protect you against HIV infection (AIDS) or other sexually transmitted diseases. You  can check the placement of the IUD yourself by reaching up to the top of your vagina with clean fingers to feel the threads. Do not pull on the threads. It is a good habit to check placement after each menstrual period. Call your doctor right away if you feel more of the IUD than just the threads or if you cannot feel the threads at all. The IUD may come out by itself. You may become pregnant if the device comes out. If you notice that the IUD has come out use a backup birth control method like condoms and call your health care provider. Using tampons will not change the position of the IUD and are okay to use during your period. What side effects may I notice from receiving this medicine? Side effects that you should report to your doctor or health care professional as soon as possible: -allergic reactions like skin rash, itching or hives, swelling of the face, lips, or tongue -fever, flu-like symptoms -genital sores -high blood pressure -no menstrual period for 6 weeks during use -pain, swelling, warmth in the leg -pelvic pain or tenderness -severe or sudden headache -signs of pregnancy -stomach cramping -sudden shortness of breath -trouble with balance, talking, or walking -unusual vaginal bleeding, discharge -yellowing of the eyes or skin Side effects that usually do not require medical attention (report to your doctor or health care professional if they continue or are bothersome): -acne -breast pain -change in sex drive or performance -changes in weight -cramping, dizziness, or faintness while the device is being inserted -headache -irregular menstrual bleeding within first 3 to 6 months of use -nausea This list may not describe all possible side effects. Call your doctor for medical advice about side effects. You may report side effects to FDA at 1-800-FDA-1088. Where should I keep my medicine? This does not apply. NOTE: This sheet is a summary. It may not cover all possible  information. If you have questions about this medicine, talk to your doctor, pharmacist, or health care provider.    2016, Elsevier/Gold Standard. (2011-02-22 13:54:04)

## 2015-04-19 ENCOUNTER — Other Ambulatory Visit: Payer: 59

## 2015-04-19 DIAGNOSIS — B181 Chronic viral hepatitis B without delta-agent: Secondary | ICD-10-CM

## 2015-04-19 LAB — COMPREHENSIVE METABOLIC PANEL
ALBUMIN: 4.4 g/dL (ref 3.6–5.1)
ALK PHOS: 65 U/L (ref 33–115)
ALT: 29 U/L (ref 6–29)
AST: 22 U/L (ref 10–30)
BUN: 9 mg/dL (ref 7–25)
CALCIUM: 9.7 mg/dL (ref 8.6–10.2)
CHLORIDE: 101 mmol/L (ref 98–110)
CO2: 26 mmol/L (ref 20–31)
Creat: 0.63 mg/dL (ref 0.50–1.10)
Glucose, Bld: 79 mg/dL (ref 65–99)
POTASSIUM: 4.2 mmol/L (ref 3.5–5.3)
Sodium: 140 mmol/L (ref 135–146)
TOTAL PROTEIN: 8.2 g/dL — AB (ref 6.1–8.1)
Total Bilirubin: 0.5 mg/dL (ref 0.2–1.2)

## 2015-04-21 LAB — HEPATITIS B E ANTIGEN: Hepatitis Be Antigen: REACTIVE — AB

## 2015-04-21 LAB — HEPATITIS B E ANTIBODY: HEPATITIS BE ANTIBODY: NONREACTIVE

## 2015-04-22 LAB — HEPATITIS B DNA, ULTRAQUANTITATIVE, PCR
HEPATITIS B DNA (CALC): 1.62 {Log_IU}/mL — AB (ref <1.30)
HEPATITIS B DNA: 42 [IU]/mL — AB (ref <20)

## 2015-04-28 ENCOUNTER — Other Ambulatory Visit: Payer: Self-pay | Admitting: Internal Medicine

## 2015-04-28 ENCOUNTER — Telehealth: Payer: Self-pay | Admitting: *Deleted

## 2015-04-28 DIAGNOSIS — B181 Chronic viral hepatitis B without delta-agent: Secondary | ICD-10-CM

## 2015-04-28 MED ORDER — TENOFOVIR DISOPROXIL FUMARATE 300 MG PO TABS: 300.0000 mg | ORAL_TABLET | Freq: Every day | ORAL | Status: DC

## 2015-04-28 NOTE — Telephone Encounter (Signed)
I sent in refills.

## 2015-04-28 NOTE — Telephone Encounter (Signed)
Patient requesting refill of viread. Please advise if she should be on this.  She is supposed to follow up April 3 in clinic.  Landis Gandy, RN

## 2015-05-02 MED FILL — VIREAD 300 MG TABLET: 300 | 30 days supply | Qty: 30 | Fill #0

## 2015-05-03 ENCOUNTER — Ambulatory Visit: Payer: 59 | Admitting: Internal Medicine

## 2015-05-09 ENCOUNTER — Ambulatory Visit (INDEPENDENT_AMBULATORY_CARE_PROVIDER_SITE_OTHER): Payer: 59 | Admitting: Internal Medicine

## 2015-05-09 ENCOUNTER — Encounter: Payer: Self-pay | Admitting: Internal Medicine

## 2015-05-09 DIAGNOSIS — B181 Chronic viral hepatitis B without delta-agent: Secondary | ICD-10-CM

## 2015-05-09 NOTE — Progress Notes (Signed)
Patient ID: Brenda Brady, female   DOB: 1975/02/09, 40 y.o.   MRN: [id number]         [facility] for Infectious Disease  Patient Active Problem List   Diagnosis Date Noted  . Chronic active viral hepatitis B ([place]) 11/05/2012    Priority: High  . IUD check up 04/11/2015  . Encounter for removal and reinsertion of IUD 03/28/2015  . Well woman exam with routine gynecological exam 12/01/2012  . Routine health maintenance 11/05/2012  . OVERWEIGHT 02/07/2009    Patient's Medications  New Prescriptions   No medications on file  Previous Medications   TENOFOVIR (VIREAD) 300 MG TABLET    Take 1 tablet (300 mg total) by mouth daily.  Modified Medications   No medications on file  Discontinued Medications   No medications on file    Subjective: Brenda Brady is in for her routine follow-up for chronic hepatitis B. She continues to take tenofovir without any side effects. She denies missing any doses. She is not on any other medications. She is concerned, however, because she was required to pay $35 co-pay when she refilled her medication last month. Previously she had never had a co-pay. She has not had any abdominal pain, nausea, vomiting or change in color of her eyes.   Review of Systems: Pertinent items are noted in HPI.  Past Medical History  Diagnosis Date  . Hepatitis B, chronic ([place])   . Hepatitis B infection   . Hepatitis B, chronic ([place])     Social History  Substance Use Topics  . Smoking status: Never Smoker   . Smokeless tobacco: Never Used  . Alcohol Use: No    No family history on file.  No Known Allergies  Objective: Filed Vitals:   05/09/15 1445  BP: 128/88  Pulse: 81  Temp: 98 F (36.7 C)  TempSrc: Oral  Weight: 119 lb (53.978 kg)   Body mass index is 24.02 kg/(m^2).  General: She is smiling and in good spirits Skin: No jaundice or rash Lungs: Clear Cor: Regular S1 and S2 with no murmurs Abdomen: Soft and nontender. I cannot palpate her liver or  spleen or any other masses   Lab Results CMP     Component Value Date/Time   NA 140 04/19/2015 0957   K 4.2 04/19/2015 0957   CL 101 04/19/2015 0957   CO2 26 04/19/2015 0957   GLUCOSE 79 04/19/2015 0957   BUN 9 04/19/2015 0957   CREATININE 0.63 04/19/2015 0957   CREATININE 0.58 10/27/2009 1859   CALCIUM 9.7 04/19/2015 0957   PROT 8.2* 04/19/2015 0957   ALBUMIN 4.4 04/19/2015 0957   AST 22 04/19/2015 0957   ALT 29 04/19/2015 0957   ALKPHOS 65 04/19/2015 0957   BILITOT 0.5 04/19/2015 0957   GFRNONAA >89 01/26/2011 0942   GFRAA >89 01/26/2011 0942   Hepatitis B E antigen 04/19/2015: Positive Hepatitis B antibody 04/19/2015: Negative Hepatitis B DNA 04/19/2015: 42   Ultrasound complete with elastography 11/30/2014  IMPRESSION: Negative abdomen ultrasound.  Median hepatic shear wave velocity is calculated at 1.75 m/sec.  Corresponding Metavir fibrosis score is F2 +some F3.  Risk of fibrosis is moderate.  Follow-up: Additional testing appropriate   Electronically Signed  By: Earle Gell M.D.  On: 11/30/2014 09:02  Problem List Items Addressed This Visit      High   Chronic active viral hepatitis B ([place])    Brenda Brady is tolerating tenofovir for her chronic active hepatitis B. She has  been on and off therapies since 2010. 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 His best I can tell she has never had elevated liver enzymes and she remains hepatitis B e antigen positive. I am not certain that the potential benefit of continuing tenofovir outweighs the risk of developing further resistance. I will review this situation with my partners. She will stay on tenofovir for now and follow-up with me in 6 months.      Relevant Orders   Hepatitis B e antibody   Hepatitis B e antigen   Hepatitis B DNA, ultraquantitative, PCR   Comprehensive metabolic panel       Michel Bickers, MD [facility] for [place] [phone]  pager   [phone] cell 05/09/2015, 3:35 PM

## 2015-05-09 NOTE — Assessment & Plan Note (Addendum)
Brenda Brady is tolerating tenofovir for her chronic active hepatitis B. She has been on and off therapies since 2010. 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 His best I can tell she has never had elevated liver enzymes and she remains hepatitis B e antigen positive. I am not certain that the potential benefit of continuing tenofovir outweighs the risk of developing further resistance. I will review this situation with my partners. She will stay on tenofovir for now and follow-up with me in 6 months.

## 2015-06-07 MED FILL — VIREAD 300 MG TABLET: 300 | 30 days supply | Qty: 30 | Fill #1

## 2015-07-05 MED FILL — VIREAD 300 MG TABLET: 300 | 30 days supply | Qty: 30 | Fill #2

## 2015-08-04 MED FILL — VIREAD 300 MG TABLET: 300 | 30 days supply | Qty: 30 | Fill #3

## 2015-09-07 MED FILL — VIREAD 300 MG TABLET: 300 | 30 days supply | Qty: 30 | Fill #4

## 2015-10-05 MED FILL — VIREAD 300 MG TABLET: 300 | 30 days supply | Qty: 30 | Fill #5

## 2015-11-01 MED FILL — VIREAD 300 MG TABLET: 300 | 30 days supply | Qty: 30 | Fill #6

## 2015-11-08 ENCOUNTER — Other Ambulatory Visit: Payer: 59

## 2015-11-08 DIAGNOSIS — B181 Chronic viral hepatitis B without delta-agent: Secondary | ICD-10-CM

## 2015-11-09 LAB — COMPREHENSIVE METABOLIC PANEL
ALK PHOS: 63 U/L (ref 33–115)
ALT: 33 U/L — ABNORMAL HIGH (ref 6–29)
AST: 26 U/L (ref 10–30)
Albumin: 4.5 g/dL (ref 3.6–5.1)
BUN: 13 mg/dL (ref 7–25)
CALCIUM: 9.9 mg/dL (ref 8.6–10.2)
CO2: 26 mmol/L (ref 20–31)
Chloride: 101 mmol/L (ref 98–110)
Creat: 0.71 mg/dL (ref 0.50–1.10)
GLUCOSE: 65 mg/dL (ref 65–99)
POTASSIUM: 4.2 mmol/L (ref 3.5–5.3)
Sodium: 137 mmol/L (ref 135–146)
Total Bilirubin: 0.8 mg/dL (ref 0.2–1.2)
Total Protein: 8.6 g/dL — ABNORMAL HIGH (ref 6.1–8.1)

## 2015-11-10 LAB — HEPATITIS B DNA, ULTRAQUANTITATIVE, PCR: Hepatitis B DNA (Calc): <1.3 Log IU/mL (ref <1.30)

## 2015-11-14 LAB — HEPATITIS B E ANTIBODY: HEPATITIS BE ANTIBODY: NONREACTIVE

## 2015-11-14 LAB — HEPATITIS B E ANTIGEN: HEPATITIS BE ANTIGEN: REACTIVE — AB

## 2015-11-22 ENCOUNTER — Ambulatory Visit: Payer: 59 | Admitting: Internal Medicine

## 2015-12-01 ENCOUNTER — Encounter (INDEPENDENT_AMBULATORY_CARE_PROVIDER_SITE_OTHER): Payer: Self-pay

## 2015-12-01 ENCOUNTER — Encounter: Payer: Self-pay | Admitting: Internal Medicine

## 2015-12-01 ENCOUNTER — Ambulatory Visit (INDEPENDENT_AMBULATORY_CARE_PROVIDER_SITE_OTHER): Payer: 59 | Admitting: Internal Medicine

## 2015-12-01 DIAGNOSIS — B181 Chronic viral hepatitis B without delta-agent: Secondary | ICD-10-CM

## 2015-12-01 DIAGNOSIS — Z23 Encounter for immunization: Secondary | ICD-10-CM | POA: Diagnosis not present

## 2015-12-01 MED ORDER — TENOFOVIR ALAFENAMIDE FUMARATE 25 MG PO TABS: 25.0000 mg | ORAL_TABLET | Freq: Every day | ORAL | 11 refills | Status: DC

## 2015-12-01 NOTE — Progress Notes (Signed)
Patient ID: Lisabeth Register, female   DOB: [birth year], 40 y.o.   MRN: [id number]         [facility] for Infectious Disease  Patient Active Problem List   Diagnosis Date Noted  . Chronic active viral hepatitis B ([place]) 11/05/2012    Priority: High  . IUD check up 04/11/2015  . Encounter for removal and reinsertion of IUD 03/28/2015  . Well woman exam with routine gynecological exam 12/01/2012  . Routine health maintenance 11/05/2012  . OVERWEIGHT 02/07/2009    Patient's Medications  New Prescriptions   TENOFOVIR ALAFENAMIDE FUMARATE (VEMLIDY) 25 MG TABS    Take 1 tablet (25 mg total) by mouth daily.  Previous Medications   No medications on file  Modified Medications   No medications on file  Discontinued Medications   TENOFOVIR (VIREAD) 300 MG TABLET    Take 1 tablet (300 mg total) by mouth daily.    Subjective: Zona is in for her routine follow-up for chronic hepatitis B. She continues to take tenofovir without any side effects. She denies missing any doses. She does recall one day when she inadvertently took 2 doses of tenofovir. She is not on any other medications. She is concerned, however, because she recently received a letter stating that her tenofovir co-pay program would expire on 02/06/2016. She has not had any abdominal pain, nausea, vomiting or change in color of her eyes.   Review of Systems: Pertinent items are noted in HPI.  Past Medical History:  Diagnosis Date  . Hepatitis B infection   . Hepatitis B, chronic ([place])   . Hepatitis B, chronic ([place])     Social History  Substance Use Topics  . Smoking status: Never Smoker  . Smokeless tobacco: Never Used  . Alcohol use No    No family history on file.  No Known Allergies  Objective: Vitals:   12/01/15 1540  BP: 129/89  Pulse: 73  Temp: 98.4 F (36.9 C)  TempSrc: Oral  Weight: 123 lb (55.8 kg)   Body mass index is 24.84 kg/m.  General: She is smiling and in good spirits Skin: No jaundice or  rash Lungs: Clear Cor: Regular S1 and S2 with no murmurs Abdomen: Soft and nontender. I cannot palpate her liver or spleen or any other masses   Lab Results CMP     Component Value Date/Time   NA 137 11/08/2015 1052   K 4.2 11/08/2015 1052   CL 101 11/08/2015 1052   CO2 26 11/08/2015 1052   GLUCOSE 65 11/08/2015 1052   BUN 13 11/08/2015 1052   CREATININE 0.71 11/08/2015 1052   CALCIUM 9.9 11/08/2015 1052   PROT 8.6 (H) 11/08/2015 1052   ALBUMIN 4.5 11/08/2015 1052   AST 26 11/08/2015 1052   ALT 33 (H) 11/08/2015 1052   ALKPHOS 63 11/08/2015 1052   BILITOT 0.8 11/08/2015 1052   GFRNONAA >89 01/26/2011 0942   GFRAA >89 01/26/2011 0942   Hepatitis B E antigen 11/08/2015: Positive Hepatitis B antibody 11/08/2015: Negative Hepatitis B DNA 11/08/2015: <20   Ultrasound complete with elastography 11/30/2014  IMPRESSION: Negative abdomen ultrasound.  Median hepatic shear wave velocity is calculated at 1.75 m/sec.  Corresponding Metavir fibrosis score is F2 +some F3.  Risk of fibrosis is moderate.  Follow-up: Additional testing appropriate   Electronically Signed  By: Earle Gell M.D.  On: 11/30/2014 09:02  Problem List Items Addressed This Visit      High   Chronic active viral hepatitis B ([place])  She has had a very slight bump in her  ALT to 33 but overall is doing very well on tenofovir therapy for her chronic, active hepatitis B. Since switching from telbivudine to tenofovir in 2015 her viral load has steadily declined and is now undetectable at less than 20. I discussed her case with my partner, Dr. Talbot Grumbling. He feels like the elevated fibrosis score on her ultrasound one year ago probably misrepresents the degree of change since she had a fairly normal biopsy in 2010. We would like to keep her on therapy. The letter about her co-pay program ending is probably related to the fact that [employer] is changing their co-pay program from tenofovir to tenofovir  alafenamide (Vemlidy). I will change her that to the new preparation now. She will follow-up after lab work in 6 months.      Relevant Medications   Tenofovir Alafenamide Fumarate (VEMLIDY) 25 MG TABS   Other Relevant Orders   Comprehensive metabolic panel   Hepatitis B DNA, ultraquantitative, PCR   Hepatitis B e antibody   Hepatitis B e antigen    Other Visit Diagnoses    Encounter for immunization       Relevant Orders   Flu Vaccine QUAD 36+ mos IM (Completed)       Michel Bickers, MD [facility] for [facility] [phone] pager   [phone] cell 12/01/2015, 4:17 PM

## 2015-12-01 NOTE — Assessment & Plan Note (Signed)
She has had a very slight bump in her  ALT to 33 but overall is doing very well on tenofovir therapy for her chronic, active hepatitis B. Since switching from telbivudine to tenofovir in 2015 her viral load has steadily declined and is now undetectable at less than 20. I discussed her case with my partner, Dr. Talbot Grumbling. He feels like the elevated fibrosis score on her ultrasound one year ago probably misrepresents the degree of change since she had a fairly normal biopsy in 2010. We would like to keep her on therapy. The letter about her co-pay program ending is probably related to the fact that [employer] is changing their co-pay program from tenofovir to tenofovir alafenamide (Vemlidy). I will change her that to the new preparation now. She will follow-up after lab work in 6 months.

## 2015-12-05 MED FILL — VIREAD 300 MG TABLET: 300 | 30 days supply | Qty: 30 | Fill #7

## 2015-12-06 ENCOUNTER — Other Ambulatory Visit: Payer: Self-pay | Admitting: Pharmacist Clinician (PhC)/ Clinical Pharmacy Specialist

## 2015-12-06 MED ORDER — TENOFOVIR DISOPROXIL FUMARATE 300 MG PO TABS: 300.0000 mg | ORAL_TABLET | Freq: Every day | ORAL | 6 refills | Status: DC

## 2015-12-09 IMAGING — US US ABDOMEN COMPLETE W/ ELASTOGRAPHY
1 series · 13 of 25 positions shown · non-contrast
Comparison: Abdomen ultrasound on 12/03/2012

CLINICAL DATA: Chronic active viral hepatitis-B.



[Series 1: us abdomen complete w/ elastography · 0.17mm/px · 13 of 75 slices shown]
[im 1/75]
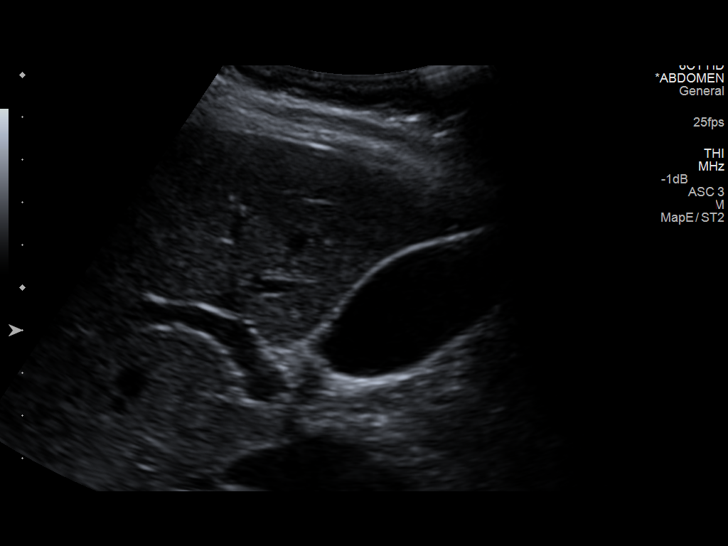
[im 7/75]
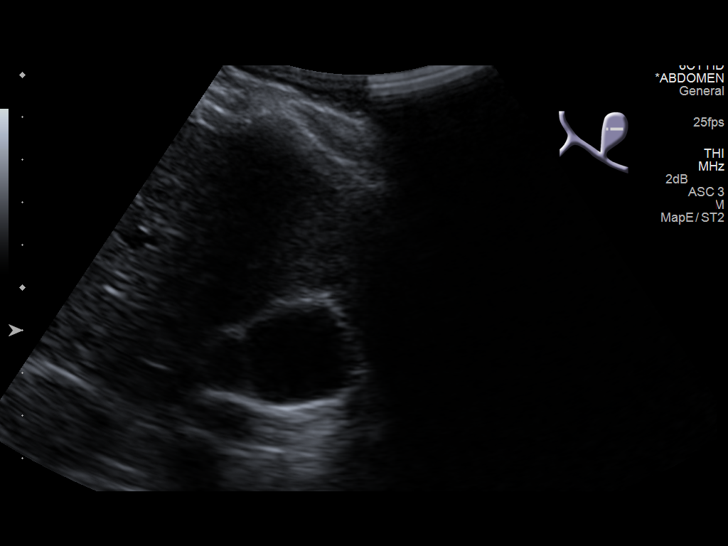
[im 13/75]
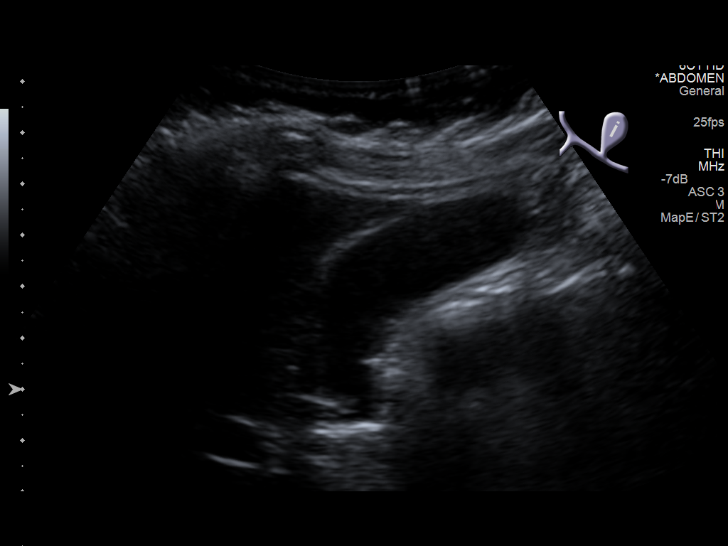
[im 19/75]
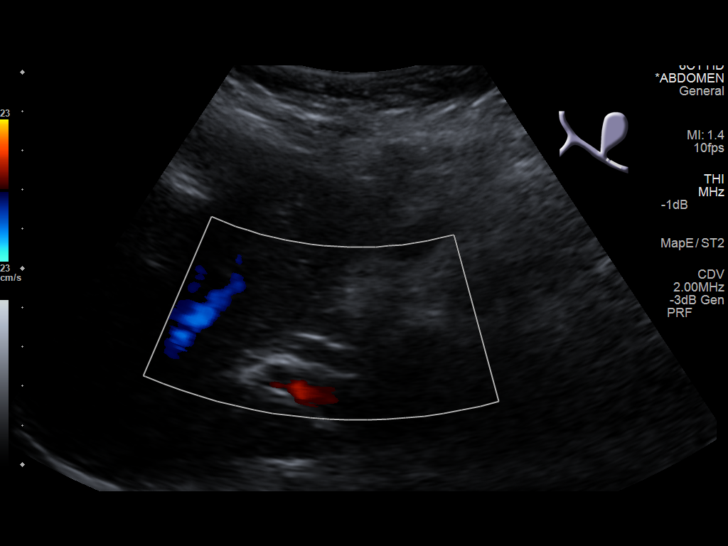
[im 25/75]
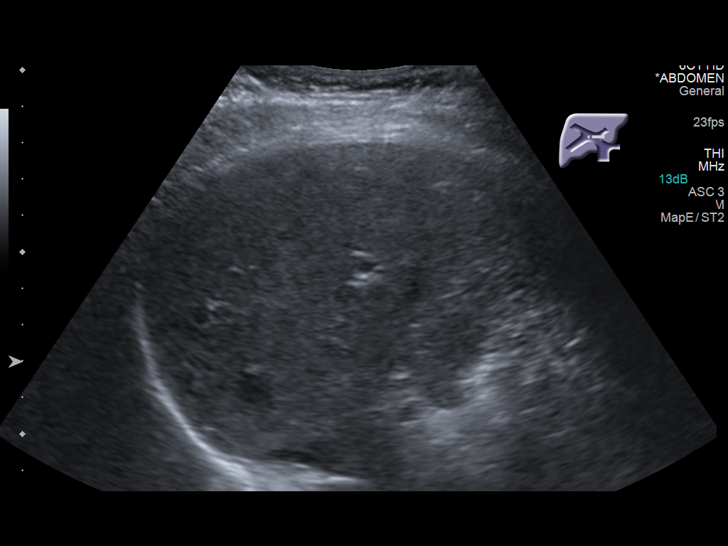
[im 31/75]
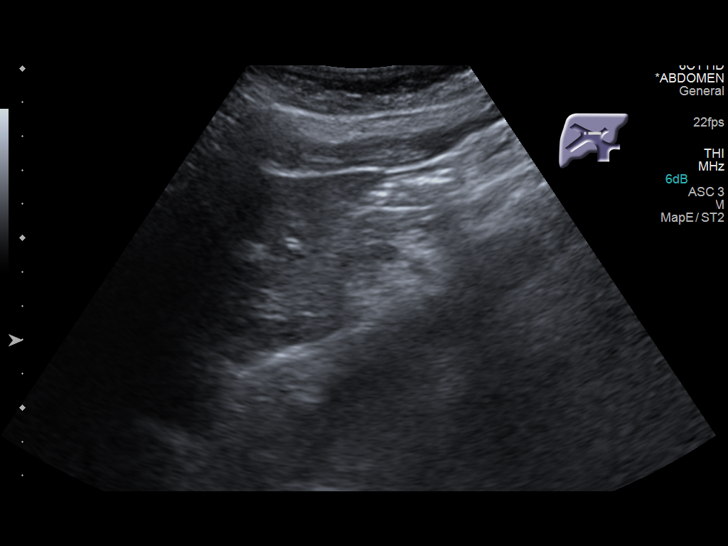
[im 38/75]
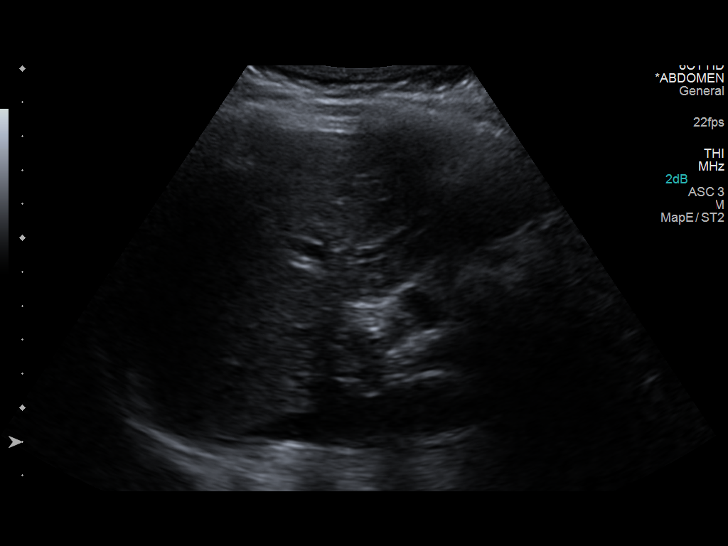
[im 44/75]
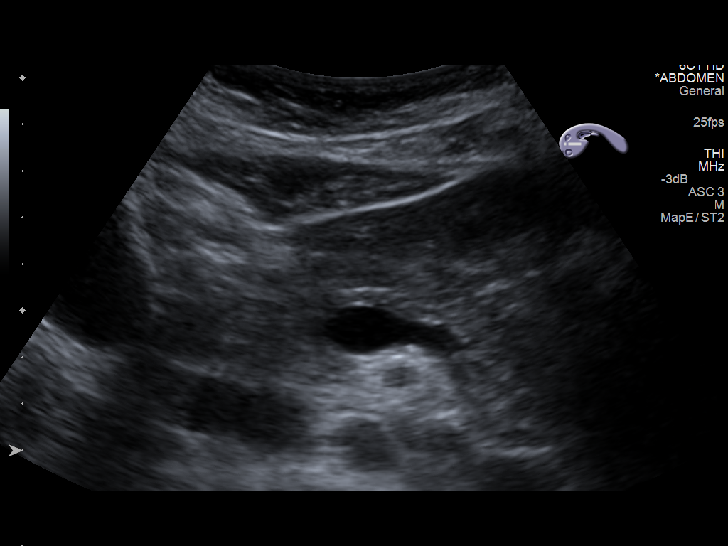
[im 50/75]
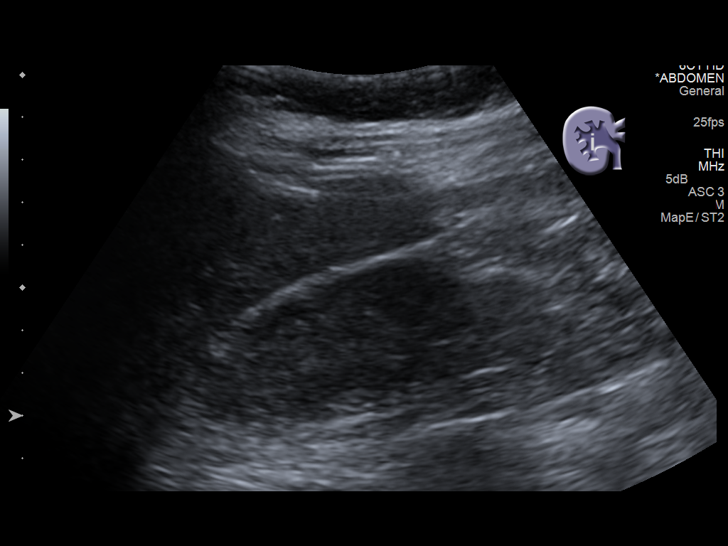
[im 56/75]
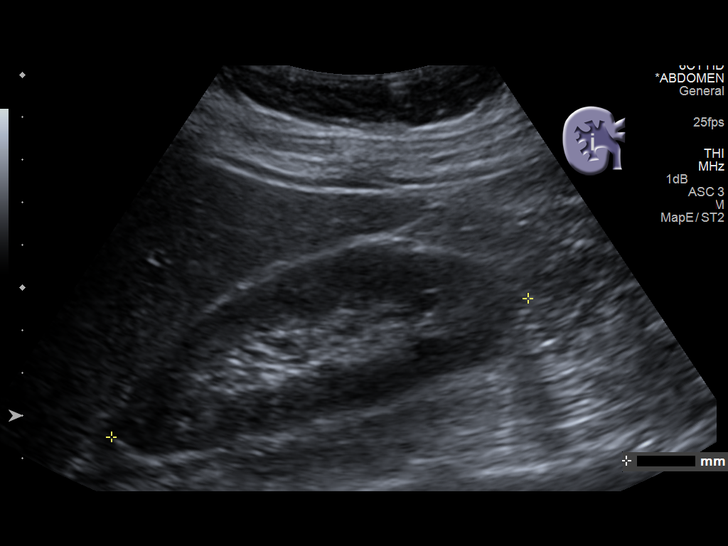
[im 62/75]
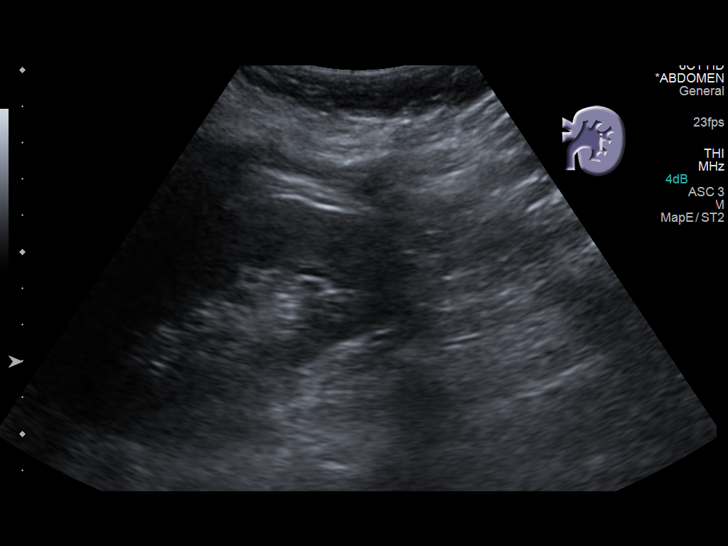
[im 68/75]
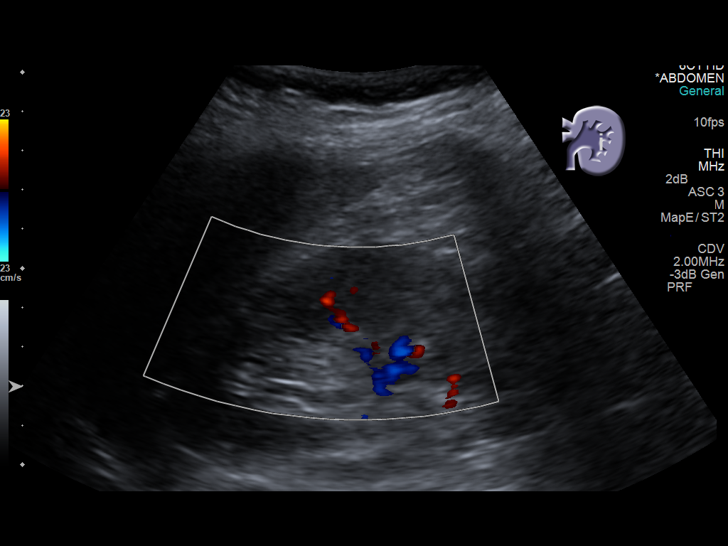
[im 75/75]
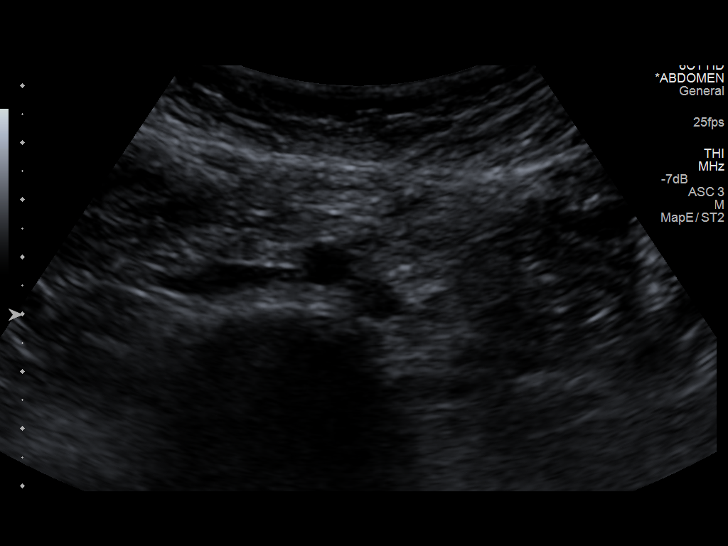

[13 of 25 positions shown; findings below may reference images not displayed]

FINDINGS: ULTRASOUND ABDOMEN

Gallbladder: No gallstones or wall thickening visualized. No
sonographic Murphy sign noted.

Common bile duct: Diameter: 6 mm, within normal limits.

Liver: No focal lesion identified. Within normal limits in
parenchymal echogenicity.

IVC: No abnormality visualized.

Pancreas: Visualized portion unremarkable.

Spleen: Size and appearance within normal limits.

Right Kidney: Length: 10.3 cm. Echogenicity within normal limits. No
mass or hydronephrosis visualized.

Left Kidney: Length: 10.7 cm. Echogenicity within normal limits. No
mass or hydronephrosis visualized.

Abdominal aorta: No aneurysm visualized.

Other findings: None.

ULTRASOUND HEPATIC ELASTOGRAPHY

Device: Siemens Helix VTQ

Transducer 6C 1

Patient position: Supine

Number of measurements:  10

Hepatic Segment:  8

Median velocity:   1.75  m/sec

IQR:

IQR/Median velocity ratio

Corresponding Metavir fibrosis score:  F2 +some F3

Risk of fibrosis: Moderate

Limitations of exam: None

Pertinent findings noted on other imaging exams:  None

Please note that abnormal shear wave velocities may also be
identified in clinical settings other than with hepatic fibrosis,
such as: acute hepatitis, elevated right heart and central venous
pressures including use of beta blockers, Julia Ines disease
([place]), infiltrative processes such as
mastocytosis/amyloidosis/infiltrative tumor, extrahepatic
cholestasis, in the post-prandial state, and liver transplantation.
Correlation with patient history, laboratory data, and clinical
condition recommended.
IMPRESSION: Negative abdomen ultrasound.

Median hepatic shear wave velocity is calculated at 1.75 m/sec.

Corresponding Metavir fibrosis score is F2 +some F3.

Risk of fibrosis is moderate.

Follow-up:  Additional testing appropriate

## 2015-12-19 ENCOUNTER — Telehealth: Payer: Self-pay | Admitting: Pharmacist Clinician (PhC)/ Clinical Pharmacy Specialist

## 2015-12-19 NOTE — Telephone Encounter (Signed)
Brenda Brady walked into clinic with the same question about Viread and drop of copay program in January. We tried [facility] for her but [facility] would not cover it due to probably not on formulary yet. Explained to her to cont to use Viread for now and we can figure out what the copay will be in January and deal with it then.

## 2016-01-02 MED FILL — VIREAD 300 MG TABLET: 300 | 30 days supply | Qty: 30 | Fill #8

## 2016-01-27 MED FILL — VIREAD 300 MG TABLET: 300 | 30 days supply | Qty: 30 | Fill #9

## 2016-03-01 ENCOUNTER — Other Ambulatory Visit: Payer: Self-pay | Admitting: Pharmacist Clinician (PhC)/ Clinical Pharmacy Specialist

## 2016-03-01 ENCOUNTER — Telehealth: Payer: Self-pay | Admitting: Pharmacist Clinician (PhC)/ Clinical Pharmacy Specialist

## 2016-03-01 MED ORDER — TENOFOVIR ALAFENAMIDE FUMARATE 25 MG PO TABS: 25.0000 mg | ORAL_TABLET | Freq: Every day | ORAL | 6 refills | Status: DC

## 2016-03-01 MED FILL — VEMLIDY 25 MG TABLET: 25 | 30 days supply | Qty: 30 | Fill #0 | Status: TO

## 2016-03-01 NOTE — Telephone Encounter (Signed)
Pharmacy called to say that her Viread copay no longer works. We anticipated this a few months ago because a generic is now available. However, the copay is still significant for her. Got Biochemist, clinical for [name] today and activate the copay card for her. She will pick up tomorrow.

## 2016-03-28 MED FILL — VEMLIDY 25 MG TABLET: 25 | 30 days supply | Qty: 30 | Fill #0

## 2016-04-23 MED FILL — VEMLIDY 25 MG TABLET: 25 | 30 days supply | Qty: 30 | Fill #1

## 2016-05-21 ENCOUNTER — Other Ambulatory Visit: Payer: 59

## 2016-05-21 DIAGNOSIS — B181 Chronic viral hepatitis B without delta-agent: Secondary | ICD-10-CM

## 2016-05-22 LAB — COMPREHENSIVE METABOLIC PANEL
ALBUMIN: 3.9 g/dL (ref 3.6–5.1)
ALK PHOS: 56 U/L (ref 33–115)
ALT: 17 U/L (ref 6–29)
AST: 17 U/L (ref 10–30)
BUN: 13 mg/dL (ref 7–25)
CALCIUM: 9.2 mg/dL (ref 8.6–10.2)
CO2: 23 mmol/L (ref 20–31)
Chloride: 104 mmol/L (ref 98–110)
Creat: 0.62 mg/dL (ref 0.50–1.10)
Glucose, Bld: 98 mg/dL (ref 65–99)
POTASSIUM: 4.1 mmol/L (ref 3.5–5.3)
Sodium: 139 mmol/L (ref 135–146)
TOTAL PROTEIN: 7.6 g/dL (ref 6.1–8.1)
Total Bilirubin: 0.3 mg/dL (ref 0.2–1.2)

## 2016-05-23 LAB — HEPATITIS B E ANTIGEN: Hepatitis Be Antigen: REACTIVE — AB

## 2016-05-23 LAB — HEPATITIS B E ANTIBODY: HEPATITIS BE ANTIBODY: NONREACTIVE

## 2016-05-24 LAB — HEPATITIS B DNA, ULTRAQUANTITATIVE, PCR: Hepatitis B DNA (Calc): <1 Log IU/mL — ABNORMAL HIGH

## 2016-06-04 ENCOUNTER — Ambulatory Visit (INDEPENDENT_AMBULATORY_CARE_PROVIDER_SITE_OTHER): Payer: 59 | Admitting: Internal Medicine

## 2016-06-04 ENCOUNTER — Encounter: Payer: Self-pay | Admitting: Internal Medicine

## 2016-06-04 DIAGNOSIS — B181 Chronic viral hepatitis B without delta-agent: Secondary | ICD-10-CM | POA: Diagnosis not present

## 2016-06-04 MED FILL — VEMLIDY 25 MG TABLET: 25 | 30 days supply | Qty: 30 | Fill #2

## 2016-06-04 NOTE — Assessment & Plan Note (Signed)
Her hepatitis B infection is under excellent control. I do not believe that her intermittent flank pain is due to her hepatitis. She will continue Vemlidy and follow-up after lab work in 6 months.

## 2016-06-04 NOTE — Progress Notes (Signed)
Patient ID: Lisabeth Register, female   DOB: [birth year], 41 y.o.   MRN: [id number]         [facility] for Infectious Disease  Patient Active Problem List   Diagnosis Date Noted  . Chronic active viral hepatitis B ([place]) 11/05/2012    Priority: High  . IUD check up 04/11/2015  . Encounter for removal and reinsertion of IUD 03/28/2015  . Well woman exam with routine gynecological exam 12/01/2012  . Routine health maintenance 11/05/2012  . OVERWEIGHT 02/07/2009    Patient's Medications  New Prescriptions   No medications on file  Previous Medications   TENOFOVIR ALAFENAMIDE FUMARATE (VEMLIDY) 25 MG TABS    Take 1 tablet (25 mg total) by mouth daily.  Modified Medications   No medications on file  Discontinued Medications   No medications on file    Subjective: Veora is in for her routine follow-up for chronic hepatitis B. She continues to take tenofovir alafenamide (Vemlidy) without any side effects. She denies missing any doses. She . She is not on any other medications. She had been having some intermittent right flank pain that she thinks is due to her hepatitis. It occurs several times each week and only last about 10-20 seconds. She does not know anything that can re-create the pain. She has not had any nausea, vomiting or change in color of her eyes.   Review of Systems: Pertinent items are noted in HPI.  Past Medical History:  Diagnosis Date  . Hepatitis B infection   . Hepatitis B, chronic ([place])   . Hepatitis B, chronic ([place])     Social History  Substance Use Topics  . Smoking status: Never Smoker  . Smokeless tobacco: Never Used  . Alcohol use No    No family history on file.  No Known Allergies  Objective: Vitals:   06/04/16 1109  BP: 121/80  Pulse: 71  Temp: 97.4 F (36.3 C)  Weight: 126 lb (57.2 kg)  Height: 4\' 10"  (1.473 m)   Body mass index is 26.33 kg/m.  General: She is smiling and in good spirits Skin: No jaundice or rash Lungs: Clear Cor:  Regular S1 and S2 with no murmurs Abdomen: Soft and nontender. I cannot palpate her liver or spleen or any other masses   Lab Results CMP     Component Value Date/Time   NA 139 05/21/2016 1629   K 4.1 05/21/2016 1629   CL 104 05/21/2016 1629   CO2 23 05/21/2016 1629   GLUCOSE 98 05/21/2016 1629   BUN 13 05/21/2016 1629   CREATININE 0.62 05/21/2016 1629   CALCIUM 9.2 05/21/2016 1629   PROT 7.6 05/21/2016 1629   ALBUMIN 3.9 05/21/2016 1629   AST 17 05/21/2016 1629   ALT 17 05/21/2016 1629   ALKPHOS 56 05/21/2016 1629   BILITOT 0.3 05/21/2016 1629   GFRNONAA >89 01/26/2011 0942   GFRAA >89 01/26/2011 0942   Hepatitis B E antigen 05/21/2016: Positive Hepatitis B antibody 05/21/2016: Negative Hepatitis B DNA 05/21/2016: <10   Ultrasound complete with elastography 11/30/2014  IMPRESSION: Negative abdomen ultrasound.  Median hepatic shear wave velocity is calculated at 1.75 m/sec.  Corresponding Metavir fibrosis score is F2 +some F3.  Risk of fibrosis is moderate.  Follow-up: Additional testing appropriate   Electronically Signed  By: Earle Gell M.D.  On: 11/30/2014 09:02  Problem List Items Addressed This Visit      High   Chronic active viral hepatitis B ([place])    Her  hepatitis B infection is under excellent control. I do not believe that her intermittent flank pain is due to her hepatitis. She will continue Vemlidy and follow-up after lab work in 6 months.      Relevant Orders   Hepatitis B e antigen   Hepatitis B DNA, ultraquantitative, PCR   Comprehensive metabolic panel   CBC       Michel Bickers, MD [facility] for [place] [phone] pager   [phone] cell 06/04/2016, 11:23 AM

## 2016-07-03 MED FILL — VEMLIDY 25 MG TABLET: 25 | 30 days supply | Qty: 30 | Fill #3

## 2016-07-30 MED FILL — VEMLIDY 25 MG TABLET: 25 | 30 days supply | Qty: 30 | Fill #4

## 2016-08-24 MED FILL — VEMLIDY 25 MG TABLET: 25 | 30 days supply | Qty: 30 | Fill #5

## 2016-09-24 ENCOUNTER — Other Ambulatory Visit: Payer: Self-pay | Admitting: *Deleted

## 2016-09-24 MED ORDER — TENOFOVIR ALAFENAMIDE FUMARATE 25 MG PO TABS: 25.0000 mg | ORAL_TABLET | Freq: Every day | ORAL | 6 refills | Status: DC

## 2016-09-24 MED FILL — VEMLIDY 25 MG TABLET: 25 | 30 days supply | Qty: 30 | Fill #0

## 2016-10-24 MED FILL — VEMLIDY 25 MG TABLET: 25 | 30 days supply | Qty: 30 | Fill #1

## 2016-11-06 ENCOUNTER — Encounter: Payer: Self-pay | Admitting: Internal Medicine

## 2016-11-06 ENCOUNTER — Ambulatory Visit (INDEPENDENT_AMBULATORY_CARE_PROVIDER_SITE_OTHER): Payer: 59 | Admitting: Internal Medicine

## 2016-11-06 DIAGNOSIS — Z23 Encounter for immunization: Secondary | ICD-10-CM

## 2016-11-06 DIAGNOSIS — B181 Chronic viral hepatitis B without delta-agent: Secondary | ICD-10-CM

## 2016-11-06 NOTE — Assessment & Plan Note (Signed)
She is doing well on chronic, suppressive therapy Her last 2 viral loads have been undetectable. I showed her that her viral load had been over 170,000,000 6 years ago before she started therapy. She will get repeat lab work today and continue [name]. She will follow-up in 6 months.

## 2016-11-06 NOTE — Progress Notes (Signed)
         [name]  Patient Active Problem List   Diagnosis Date Noted  . Chronic active viral hepatitis B ([place]) 11/05/2012    Priority: High  . IUD check up 04/11/2015  . Encounter for removal and reinsertion of IUD 03/28/2015  . Well woman exam with routine gynecological exam 12/01/2012  . Routine health maintenance 11/05/2012  . OVERWEIGHT 02/07/2009    Patient's Medications  New Prescriptions   No medications on file  Previous Medications   TENOFOVIR ALAFENAMIDE FUMARATE (VEMLIDY) 25 MG TABS    Take 1 tablet (25 mg total) by mouth daily.  Modified Medications   No medications on file  Discontinued Medications   No medications on file    Subjective: Brenda Brady is in for her routine hepatitis B follow-up visit. She has had no problems obtaining, taking or tolerating her Vemlidy. It is mailed to her home each month. He does not recall missing any doses.  Review of Systems: Review of Systems  Constitutional: Negative for chills, diaphoresis and fever.  Gastrointestinal: Negative for abdominal pain, diarrhea, nausea and vomiting.    Past Medical History:  Diagnosis Date  . Hepatitis B infection   . Hepatitis B, chronic ([place])   . Hepatitis B, chronic ([place])     Social History  Substance Use Topics  . Smoking status: Never Smoker  . Smokeless tobacco: Never Used  . Alcohol use No    No family history on file.  No Known Allergies  Objective: Vitals:   11/06/16 0907  BP: 123/81  Pulse: 82  Temp: 98.5 F (36.9 C)  TempSrc: Oral  Weight: 131 lb (59.4 kg)   Body mass index is 27.38 kg/m.  Physical Exam  Constitutional: She is oriented to person, place, and time.  She is smiling and in good spirits as usual. She is examined with the help of the interpreter.  Abdominal: Soft. She exhibits no distension and no mass. There is no tenderness.  Neurological: She is alert and oriented to person, place, and time.  Skin: No rash noted.    Psychiatric: Mood and affect normal.    Lab Results    Problem List Items Addressed This Visit      High   Chronic active viral hepatitis B ([name])    She is doing well on chronic, suppressive therapy Her last 2 viral loads have been undetectable. I showed her that her viral load had been over 170,000,000 6 years ago before she started therapy. She will get repeat lab work today and continue [name]. She will follow-up in 6 months.      Relevant Orders   Hepatitis B DNA, ultraquantitative, PCR   Hepatitis B e antibody   Hepatitis B e antigen       Michel Bickers, MD [facility] for [place] [phone] pager   [phone] cell 11/06/2016, 9:22 AM

## 2016-11-07 LAB — HEPATITIS B E ANTIGEN: HEP B E AG: REACTIVE — AB

## 2016-11-07 LAB — HEPATITIS B E ANTIBODY: HEP B E AB: NONREACTIVE

## 2016-11-08 LAB — HEPATITIS B DNA, ULTRAQUANTITATIVE, PCR

## 2016-11-16 MED FILL — VEMLIDY 25 MG TABLET: 25 | 30 days supply | Qty: 30 | Fill #2

## 2016-12-13 ENCOUNTER — Other Ambulatory Visit: Payer: Self-pay | Admitting: Pharmacist

## 2016-12-14 MED FILL — VEMLIDY 25 MG TABLET: 25 | 30 days supply | Qty: 30 | Fill #3

## 2017-01-09 MED FILL — VEMLIDY 25 MG TABLET: 25 | 30 days supply | Qty: 30 | Fill #4

## 2017-02-08 MED FILL — VEMLIDY 25 MG TABLET: 25 | 30 days supply | Qty: 30 | Fill #5

## 2017-03-07 MED FILL — VEMLIDY 25 MG TABLET: 25 | 30 days supply | Qty: 30 | Fill #6

## 2017-03-29 ENCOUNTER — Other Ambulatory Visit: Payer: Self-pay | Admitting: Internal Medicine

## 2017-04-01 MED FILL — VEMLIDY 25 MG TABLET: 25 | 30 days supply | Qty: 30 | Fill #0

## 2017-05-07 MED FILL — VEMLIDY 25 MG TABLET: 25 | 30 days supply | Qty: 30 | Fill #1

## 2017-05-09 ENCOUNTER — Encounter: Payer: Self-pay | Admitting: Internal Medicine

## 2017-05-09 ENCOUNTER — Ambulatory Visit: Payer: 59 | Admitting: Internal Medicine

## 2017-05-09 DIAGNOSIS — B181 Chronic viral hepatitis B without delta-agent: Secondary | ICD-10-CM

## 2017-05-09 NOTE — Assessment & Plan Note (Signed)
Her hepatitis B is completely suppressed.  She continues to express hepatitis B E antigen but her last 3 viral loads have been undetectable.  She will continue Vemlidy and get repeat blood work today.  She will follow-up in 6 months.

## 2017-05-09 NOTE — Progress Notes (Signed)
         [place] for Infectious Disease  Patient Active Problem List   Diagnosis Date Noted  . Chronic active viral hepatitis B ([place]) 11/05/2012    Priority: High  . IUD check up 04/11/2015  . Encounter for removal and reinsertion of IUD 03/28/2015  . Well woman exam with routine gynecological exam 12/01/2012  . Routine health maintenance 11/05/2012  . OVERWEIGHT 02/07/2009    Patient's Medications  New Prescriptions   No medications on file  Previous Medications   FLUTICASONE (FLONASE) 50 MCG/ACT NASAL SPRAY    Place 1 spray into both nostrils daily as needed.    VEMLIDY 25 MG TABS    TAKE 1 TABLET BY MOUTH ONCE DAILY  Modified Medications   No medications on file  Discontinued Medications   No medications on file    Subjective: Brenda Brady is in with her interpreter today.  She has had no problems obtaining, taking or tolerating her Vemlidy.  She takes it each evening around 8:30 PM.  She does not recall missing doses.  Review of Systems: Review of Systems  Constitutional: Negative for chills, diaphoresis, fever and weight loss.  Gastrointestinal: Negative for abdominal pain, nausea and vomiting.  Neurological: Positive for headaches.    Past Medical History:  Diagnosis Date  . Hepatitis B infection   . Hepatitis B, chronic ([place])   . Hepatitis B, chronic (H[facility])     Social History   Tobacco Use  . Smoking status: Never Smoker  . Smokeless tobacco: Never Used  Substance Use Topics  . Alcohol use: No  . Drug use: No    No family history on file.  No Known Allergies  Objective: Vitals:   05/09/17 0900  BP: (!) 135/93  Pulse: 69  Temp: (!) 97.5 F (36.4 C)  Weight: 129 lb (58.5 kg)  Height: 4\' 9"  (1.448 m)   Body mass index is 27.92 kg/m.  Physical Exam  Constitutional: She is oriented to person, place, and time.  She is in good spirits.  She is accompanied by her interpreter.  Cardiovascular: Normal rate and regular rhythm.  No murmur  heard. Pulmonary/Chest: Effort normal. She has no rales.  Abdominal: Soft. She exhibits no mass. There is no tenderness. There is no rebound.  Neurological: She is alert and oriented to person, place, and time.  Skin: No rash noted.  Psychiatric: Mood and affect normal.    Lab Results    Problem List Items Addressed This Visit      High   Chronic active viral hepatitis B ([place])    Her hepatitis B is completely suppressed.  She continues to express hepatitis B E antigen but her last 3 viral loads have been undetectable.  She will continue Vemlidy and get repeat blood work today.  She will follow-up in 6 months.      Relevant Orders   Hepatitis B DNA, ultraquantitative, PCR   Hepatitis B e antibody   Hepatitis B e antigen   CBC   Comprehensive metabolic panel       Michel Bickers, MD [facility] for [place] [facility] [phone] pager   [phone] cell 05/09/2017, 9:16 AM

## 2017-05-10 LAB — HEPATITIS B E ANTIBODY: Hep B E Ab: NONREACTIVE

## 2017-05-10 LAB — COMPREHENSIVE METABOLIC PANEL
AG Ratio: 1.2 (calc) (ref 1.0–2.5)
ALT: 13 U/L (ref 6–29)
AST: 13 U/L (ref 10–30)
Albumin: 4.1 g/dL (ref 3.6–5.1)
Alkaline phosphatase (APISO): 51 U/L (ref 33–115)
BUN: 10 mg/dL (ref 7–25)
CO2: 27 mmol/L (ref 20–32)
Calcium: 9.2 mg/dL (ref 8.6–10.2)
Chloride: 105 mmol/L (ref 98–110)
Creat: 0.62 mg/dL (ref 0.50–1.10)
Globulin: 3.4 g/dL (calc) (ref 1.9–3.7)
Glucose, Bld: 98 mg/dL (ref 65–99)
Potassium: 4.1 mmol/L (ref 3.5–5.3)
Sodium: 139 mmol/L (ref 135–146)
Total Bilirubin: 0.7 mg/dL (ref 0.2–1.2)
Total Protein: 7.5 g/dL (ref 6.1–8.1)

## 2017-05-10 LAB — CBC
HCT: 38.4 % (ref 35.0–45.0)
Hemoglobin: 11.9 g/dL (ref 11.7–15.5)
MCH: 22.9 pg — ABNORMAL LOW (ref 27.0–33.0)
MCHC: 31 g/dL — ABNORMAL LOW (ref 32.0–36.0)
MCV: 74 fL — ABNORMAL LOW (ref 80.0–100.0)
MPV: 10.4 fL (ref 7.5–12.5)
Platelets: 295 10*3/uL (ref 140–400)
RBC: 5.19 10*6/uL — ABNORMAL HIGH (ref 3.80–5.10)
RDW: 14.8 % (ref 11.0–15.0)
WBC: 9.3 10*3/uL (ref 3.8–10.8)

## 2017-05-10 LAB — HEPATITIS B E ANTIGEN: Hep B E Ag: REACTIVE — AB

## 2017-05-11 LAB — HEPATITIS B DNA, ULTRAQUANTITATIVE, PCR: Hepatitis B DNA: <10 IU/mL — ABNORMAL HIGH

## 2017-06-20 ENCOUNTER — Other Ambulatory Visit: Payer: Self-pay | Admitting: *Deleted

## 2017-06-20 DIAGNOSIS — B181 Chronic viral hepatitis B without delta-agent: Secondary | ICD-10-CM

## 2017-06-20 MED ORDER — TENOFOVIR ALAFENAMIDE FUMARATE 25 MG PO TABS: 1.0000 | ORAL_TABLET | Freq: Every day | ORAL | 5 refills | Status: DC

## 2017-06-21 ENCOUNTER — Other Ambulatory Visit: Payer: Self-pay | Admitting: Pharmacist

## 2017-06-21 DIAGNOSIS — B181 Chronic viral hepatitis B without delta-agent: Secondary | ICD-10-CM

## 2017-06-22 MED FILL — VEMLIDY 25 MG TABLET: 25 | 30 days supply | Qty: 30 | Fill #2

## 2017-07-18 MED FILL — VEMLIDY 25 MG TABLET: 25 | 30 days supply | Qty: 30 | Fill #0

## 2017-08-15 ENCOUNTER — Other Ambulatory Visit: Payer: Self-pay | Admitting: Pharmacist Clinician (PhC)/ Clinical Pharmacy Specialist

## 2017-08-15 DIAGNOSIS — B181 Chronic viral hepatitis B without delta-agent: Secondary | ICD-10-CM

## 2017-08-15 MED ORDER — TENOFOVIR ALAFENAMIDE FUMARATE 25 MG PO TABS: 1.0000 | ORAL_TABLET | Freq: Every day | ORAL | 5 refills | Status: DC

## 2017-08-15 NOTE — Progress Notes (Signed)
Has to be filled at [facility] now.

## 2017-08-15 NOTE — Progress Notes (Signed)
[facility] can't fill anymore. We will try [facility].

## 2017-08-20 ENCOUNTER — Telehealth: Payer: Self-pay | Admitting: Pharmacist Clinician (PhC)/ Clinical Pharmacy Specialist

## 2017-08-20 NOTE — Telephone Encounter (Signed)
[name] is out of Brenda Brady now for a week because her insurance requires the rx to be filled at [facility] from now on. Called [facility] to give them the copay card info. Copay now is zero. They will reach out with a vietnamese translator to se up delivery.

## 2017-11-14 ENCOUNTER — Ambulatory Visit (INDEPENDENT_AMBULATORY_CARE_PROVIDER_SITE_OTHER): Payer: 59 | Admitting: Internal Medicine

## 2017-11-14 ENCOUNTER — Encounter: Payer: Self-pay | Admitting: Internal Medicine

## 2017-11-14 DIAGNOSIS — B181 Chronic viral hepatitis B without delta-agent: Secondary | ICD-10-CM

## 2017-11-14 MED ORDER — TENOFOVIR ALAFENAMIDE FUMARATE 25 MG PO TABS: 1.0000 | ORAL_TABLET | Freq: Every day | ORAL | 5 refills | Status: DC

## 2017-11-14 NOTE — Assessment & Plan Note (Addendum)
Her hepatitis B is fully suppressed on Vemlidy.  She will get repeat lab work today, continue [place] and follow-up in 6 months.

## 2017-11-14 NOTE — Progress Notes (Signed)
         [place] for Infectious Disease  Patient Active Problem List   Diagnosis Date Noted  . Chronic active viral hepatitis B ([place]) 11/05/2012    Priority: High  . IUD check up 04/11/2015  . Encounter for removal and reinsertion of IUD 03/28/2015  . Well woman exam with routine gynecological exam 12/01/2012  . Routine health maintenance 11/05/2012  . OVERWEIGHT 02/07/2009    Patient's Medications  New Prescriptions   No medications on file  Previous Medications   FLUTICASONE (FLONASE) 50 MCG/ACT NASAL SPRAY    Place 1 spray into both nostrils daily as needed.   Modified Medications   Modified Medication Previous Medication   TENOFOVIR ALAFENAMIDE FUMARATE (VEMLIDY) 25 MG TABS Tenofovir Alafenamide Fumarate (VEMLIDY) 25 MG TABS      Take 1 tablet (25 mg total) by mouth daily.    Take 1 tablet (25 mg total) by mouth daily.  Discontinued Medications   No medications on file    Subjective: Brenda Brady is in for her routine follow-up visit.  She has had no problems obtaining, taking or tolerating her Vemlidy.  She denies missing any doses.  She had one episode of nausea, vomiting and diarrhea 2 days ago that resolved spontaneously.  She is feeling well.  She has not had any abdominal pain.  Review of Systems: Review of Systems  Constitutional: Negative for chills, diaphoresis, fever, malaise/fatigue and weight loss.  Gastrointestinal: Positive for diarrhea, nausea and vomiting. Negative for abdominal pain.    Past Medical History:  Diagnosis Date  . Hepatitis B infection   . Hepatitis B, chronic ([place])   . Hepatitis B, chronic (H[facility])     Social History   Tobacco Use  . Smoking status: Never Smoker  . Smokeless tobacco: Never Used  Substance Use Topics  . Alcohol use: No  . Drug use: No    No family history on file.  No Known Allergies  Objective: Vitals:   11/14/17 0838  BP: 120/83  Pulse: 70  Temp: 98 F (36.7 C)  TempSrc: Oral  Weight: 131 lb (59.4 kg)   Height: 4\' 9"  (1.448 m)   Body mass index is 28.35 kg/m.  Physical Exam  Constitutional: She is oriented to person, place, and time.  She is in good spirits.  Abdominal: Soft. She exhibits no distension and no mass. There is no tenderness.  Neurological: She is alert and oriented to person, place, and time.  Skin: No rash noted.  Psychiatric: She has a normal mood and affect.    Lab Results    Problem List Items Addressed This Visit      High   Chronic active viral hepatitis B ([place])    Her hepatitis B is fully suppressed on Vemlidy.  She will get repeat lab work today, continue [place] and follow-up in 6 months.      Relevant Medications   Tenofovir Alafenamide Fumarate (VEMLIDY) 25 MG TABS   Other Relevant Orders   Hepatitis B DNA, ultraquantitative, PCR   Hepatitis B e antibody   Hepatitis B e antigen       Michel Bickers, MD [facility] for [place] [facility] [phone] pager   [phone] cell 11/14/2017, 8:49 AM

## 2017-11-18 LAB — HEPATITIS B E ANTIBODY: HEP B E AB: NONREACTIVE

## 2017-11-18 LAB — HEPATITIS B DNA, ULTRAQUANTITATIVE, PCR: Hepatitis B DNA (Calc): <1 Log IU/mL — ABNORMAL HIGH

## 2017-11-18 LAB — HEPATITIS B E ANTIGEN: Hep B E Ag: REACTIVE — AB

## 2018-02-07 ENCOUNTER — Telehealth: Payer: Self-pay | Admitting: Pharmacist

## 2018-02-07 NOTE — Telephone Encounter (Signed)
Redid patient's Nanda for continuation of Descov[facility]. Asked for urgent review.

## 2018-02-07 NOTE — Telephone Encounter (Signed)
Patient's Mari for Descovy is approved through 02/08/2020.

## 2018-05-26 ENCOUNTER — Other Ambulatory Visit: Payer: Self-pay | Admitting: Internal Medicine

## 2018-05-26 DIAGNOSIS — B181 Chronic viral hepatitis B without delta-agent: Secondary | ICD-10-CM

## 2018-05-27 ENCOUNTER — Ambulatory Visit: Payer: 59 | Admitting: Internal Medicine

## 2018-05-28 ENCOUNTER — Ambulatory Visit: Payer: 59 | Admitting: Internal Medicine

## 2018-05-28 ENCOUNTER — Other Ambulatory Visit: Payer: Self-pay

## 2018-05-28 ENCOUNTER — Encounter: Payer: Self-pay | Admitting: Internal Medicine

## 2018-05-28 DIAGNOSIS — B181 Chronic viral hepatitis B without delta-agent: Secondary | ICD-10-CM | POA: Diagnosis not present

## 2018-05-28 MED ORDER — TENOFOVIR ALAFENAMIDE FUMARATE 25 MG PO TABS: 1.0000 | ORAL_TABLET | Freq: Every day | ORAL | 11 refills | Status: DC

## 2018-05-28 NOTE — Progress Notes (Signed)
         [place] for Infectious Disease  Patient Active Problem List   Diagnosis Date Noted  . Chronic active viral hepatitis B ([place]) 11/05/2012    Priority: High  . IUD check up 04/11/2015  . Encounter for removal and reinsertion of IUD 03/28/2015  . Well woman exam with routine gynecological exam 12/01/2012  . Routine health maintenance 11/05/2012  . OVERWEIGHT 02/07/2009    Patient's Medications  New Prescriptions   No medications on file  Previous Medications   FLUTICASONE (FLONASE) 50 MCG/ACT NASAL SPRAY    Place 1 spray into both nostrils daily as needed.   Modified Medications   Modified Medication Previous Medication   TENOFOVIR ALAFENAMIDE FUMARATE (VEMLIDY) 25 MG TABS VEMLIDY 25 MG TABS      Take 1 tablet (25 mg total) by mouth daily. with food    TAKE 1 TABLET BY MOUTH  DAILY WITH FOOD  Discontinued Medications   No medications on file    Subjective: Brenda Brady is in for her routine follow-up visit.  She has had no problems obtaining, taking or tolerating her Vemlidy. She denies missing any doses.  She is feeling well.  She has not had any abdominal pain.  The only other medication she is on currently is vitamin C.  Review of Systems: Review of Systems  Constitutional: Negative for chills, diaphoresis, fever, malaise/fatigue and weight loss.  Gastrointestinal: Negative for abdominal pain, diarrhea, nausea and vomiting.    Past Medical History:  Diagnosis Date  . Hepatitis B infection   . Hepatitis B, chronic ([place])   . Hepatitis B, chronic (H[facility])     Social History   Tobacco Use  . Smoking status: Never Smoker  . Smokeless tobacco: Never Used  Substance Use Topics  . Alcohol use: No  . Drug use: No    No family history on file.  No Known Allergies  Objective: Vitals:   05/28/18 1035  BP: 110/77  Pulse: 94  Temp: 98 F (36.7 C)  Weight: 136 lb (61.7 kg)  Height: 4\' 9"  (1.448 m)   Body mass index is 29.43 kg/m.  Physical Exam  Constitutional:      Comments: She is in good spirits as usual.  Abdominal:     General: There is no distension.     Palpations: Abdomen is soft. There is no mass.     Tenderness: There is no abdominal tenderness.  Skin:    Findings: No rash.  Neurological:     Mental Status: She is alert and oriented to person, place, and time.     Lab Results 11/14/2017 Hepatitis B e antigen positive Hepatitis B e antibody negative Hepatitis B viral load less than 10  Problem List Items Addressed This Visit      High   Chronic active viral hepatitis B ([name])    Her hepatitis B infection remains under excellent, long-term control with Vemlidy.  She will get repeat blood work today, continue [place] and follow-up in 6 months.      Relevant Medications   Tenofovir Alafenamide Fumarate (VEMLIDY) 25 MG TABS   Other Relevant Orders   CBC   Comprehensive metabolic panel   Hepatitis B DNA, ultraquantitative, PCR   Hepatitis B e antibody   Hepatitis B e antigen       Michel Bickers, MD [facility] for Infectious [place] [facility] [phone] pager   [phone] cell 05/28/2018, 10:55 AM

## 2018-05-28 NOTE — Assessment & Plan Note (Signed)
Her hepatitis B infection remains under excellent, long-term control with Vemlidy.  She will get repeat blood work today, continue [place] and follow-up in 6 months.

## 2018-05-30 LAB — COMPREHENSIVE METABOLIC PANEL
AG Ratio: 1.2 (calc) (ref 1.0–2.5)
ALT: 12 U/L (ref 6–29)
AST: 15 U/L (ref 10–30)
Albumin: 4.3 g/dL (ref 3.6–5.1)
Alkaline phosphatase (APISO): 60 U/L (ref 31–125)
BUN: 12 mg/dL (ref 7–25)
CO2: 29 mmol/L (ref 20–32)
Calcium: 9.6 mg/dL (ref 8.6–10.2)
Chloride: 103 mmol/L (ref 98–110)
Creat: 0.75 mg/dL (ref 0.50–1.10)
Globulin: 3.5 g/dL (calc) (ref 1.9–3.7)
Glucose, Bld: 97 mg/dL (ref 65–99)
Potassium: 4.2 mmol/L (ref 3.5–5.3)
Sodium: 138 mmol/L (ref 135–146)
Total Bilirubin: 0.5 mg/dL (ref 0.2–1.2)
Total Protein: 7.8 g/dL (ref 6.1–8.1)

## 2018-05-30 LAB — HEPATITIS B DNA, ULTRAQUANTITATIVE, PCR
Hepatitis B DNA (Calc): <1 Log IU/mL — ABNORMAL HIGH
Hepatitis B DNA: <10 IU/mL — ABNORMAL HIGH

## 2018-05-30 LAB — CBC
HCT: 39.2 % (ref 35.0–45.0)
Hemoglobin: 12.1 g/dL (ref 11.7–15.5)
MCH: 23.2 pg — ABNORMAL LOW (ref 27.0–33.0)
MCHC: 30.9 g/dL — ABNORMAL LOW (ref 32.0–36.0)
MCV: 75.2 fL — ABNORMAL LOW (ref 80.0–100.0)
MPV: 9.8 fL (ref 7.5–12.5)
Platelets: 308 10*3/uL (ref 140–400)
RBC: 5.21 10*6/uL — ABNORMAL HIGH (ref 3.80–5.10)
RDW: 14.5 % (ref 11.0–15.0)
WBC: 10.2 10*3/uL (ref 3.8–10.8)

## 2018-05-30 LAB — HEPATITIS B E ANTIGEN: Hep B E Ag: REACTIVE — AB

## 2018-05-30 LAB — HEPATITIS B E ANTIBODY: Hep B E Ab: NONREACTIVE

## 2018-12-02 ENCOUNTER — Other Ambulatory Visit: Payer: Self-pay

## 2018-12-02 ENCOUNTER — Encounter: Payer: Self-pay | Admitting: Internal Medicine

## 2018-12-02 ENCOUNTER — Telehealth: Payer: Self-pay | Admitting: Pharmacy Technician

## 2018-12-02 ENCOUNTER — Ambulatory Visit (INDEPENDENT_AMBULATORY_CARE_PROVIDER_SITE_OTHER): Payer: Self-pay | Admitting: Internal Medicine

## 2018-12-02 DIAGNOSIS — B181 Chronic viral hepatitis B without delta-agent: Secondary | ICD-10-CM

## 2018-12-02 DIAGNOSIS — Z23 Encounter for immunization: Secondary | ICD-10-CM

## 2018-12-02 MED ORDER — VEMLIDY 25 MG PO TABS: 1.0000 | ORAL_TABLET | Freq: Every day | ORAL | 11 refills | Status: DC

## 2018-12-02 NOTE — Progress Notes (Signed)
[facility] for Infectious Disease  Patient Active Problem List   Diagnosis Date Noted  . Chronic active viral hepatitis B ([place]) 11/05/2012    Priority: High  . IUD check up 04/11/2015  . Encounter for removal and reinsertion of IUD 03/28/2015  . Well woman exam with routine gynecological exam 12/01/2012  . Routine health maintenance 11/05/2012  . OVERWEIGHT 02/07/2009    Patient's Medications  New Prescriptions   No medications on file  Previous Medications   FLUTICASONE (FLONASE) 50 MCG/ACT NASAL SPRAY    Place 1 spray into both nostrils daily as needed.   Modified Medications   Modified Medication Previous Medication   TENOFOVIR ALAFENAMIDE FUMARATE (VEMLIDY) 25 MG TABS Tenofovir Alafenamide Fumarate (VEMLIDY) 25 MG TABS      Take 1 tablet (25 mg total) by mouth daily. with food    Take 1 tablet (25 mg total) by mouth daily. with food  Discontinued Medications   No medications on file    Subjective: Sayge is in for her routine follow-up visit.  She has had no problems obtaining, taking or tolerating her Shirlee Latch but says that she no longer has insurance and will run out of her Vemlidy in about 1 week. She denies missing any doses.  She is feeling well.  She has not had any abdominal pain.    Review of Systems: Review of Systems  Constitutional: Negative for chills, diaphoresis, fever, malaise/fatigue and weight loss.  Gastrointestinal: Negative for abdominal pain, constipation, diarrhea, nausea and vomiting.  Psychiatric/Behavioral: Negative for depression.    Past Medical History:  Diagnosis Date  . Hepatitis B infection   . Hepatitis B, chronic ([place])   . Hepatitis B, chronic (HCC)     Social History   Tobacco Use  . Smoking status: Never Smoker  . Smokeless tobacco: Never Used  Substance Use Topics  . Alcohol use: No  . Drug use: No    No family history on file.  No Known Allergies  Objective: Vitals:   12/02/18 1034  BP: 134/86  Pulse:  80  Temp: 97.7 F (36.5 C)  TempSrc: Oral  Weight: 135 lb 12.8 oz (61.6 kg)   Body mass index is 29.39 kg/m.  Physical Exam Constitutional:      Comments: She is in good spirits.  She was examined with the aid of the interpreter.  Abdominal:     General: There is no distension.     Palpations: Abdomen is soft. There is no mass.     Tenderness: There is no abdominal tenderness.  Skin:    Findings: No rash.  Neurological:     Mental Status: She is alert and oriented to person, place, and time.     Lab Results 05/28/2018 Hepatitis B e antigen positive Hepatitis B e antibody negative Hepatitis B viral load less than 10  Problem List Items Addressed This Visit      High   Chronic active viral hepatitis B ([place])    Her chronic hepatitis B has come under excellent, long-term control since starting Vemlidy several years ago.  Our pharmacy staff has been able to get her patient assistance so that she will be able to get a continuous supply.  She will get repeat lab work today and follow-up in 6 months.  She received her influenza vaccine today.      Relevant Medications   Tenofovir Alafenamide Fumarate (VEMLIDY) 25 MG TABS   Other Relevant Orders  Hepatitis B DNA, ultraquantitative, PCR   Hepatitis B e antibody   Hepatitis B e antigen   Comprehensive metabolic panel   CBC       Michel Bickers, MD [facility] for [place] [phone] pager   [phone] cell 12/02/2018, 11:07 AM

## 2018-12-02 NOTE — Assessment & Plan Note (Signed)
Her chronic hepatitis B has come under excellent, long-term control since starting Vemlidy several years ago.  Our pharmacy staff has been able to get her patient assistance so that she will be able to get a continuous supply.  She will get repeat lab work today and follow-up in 6 months.  She received her influenza vaccine today.

## 2018-12-02 NOTE — Telephone Encounter (Signed)
RCID Patient Nurse, adult for [employer] Advancing Access Patient Assistance Program for [place] has been submitted.  I have spoken with the patient and they would like to fill at [facility] once patient assistance is approved.  Patient's husband recently lost his job and insurance as a result. This program is for uninsured and covers the medication at $0 cost to the patient. Patient knows to call the office with questions or concerns during processing time.  Bartholomew Crews, CPhT Specialty Pharmacy Patient [facility] for Infectious Disease Phone: [phone] Fax: [phone] 12/02/2018 11:18 AM

## 2018-12-03 MED FILL — VEMLIDY 25 MG TABLET: 25 | 30 days supply | Qty: 30 | Fill #0

## 2018-12-03 NOTE — Telephone Encounter (Signed)
RCID Patient Advocate Encounter   Patient has been approved for [employer] Advancing Access Patient Assistance Program for [facility] from 12/03/2018 to 12/03/2019. This assistance will make the patient's copay $0.  I have spoken with the patient and they will pick up today at [facility].  The billing information is as follows:  Member ID: [id number] [place]: [id number] PCN: [id number] Group: [id number]  Patient knows to call the office with questions or concerns.  Bartholomew Crews, CPhT Specialty Pharmacy Patient [facility] for Infectious Disease Phone: [phone] Fax: [phone] 12/03/2018 9:43 AM

## 2018-12-11 LAB — CBC
HCT: 40.4 % (ref 35.0–45.0)
Hemoglobin: 12.4 g/dL (ref 11.7–15.5)
MCH: 22.8 pg — ABNORMAL LOW (ref 27.0–33.0)
MCHC: 30.7 g/dL — ABNORMAL LOW (ref 32.0–36.0)
MCV: 74.1 fL — ABNORMAL LOW (ref 80.0–100.0)
MPV: 10.5 fL (ref 7.5–12.5)
Platelets: 293 10*3/uL (ref 140–400)
RBC: 5.45 10*6/uL — ABNORMAL HIGH (ref 3.80–5.10)
RDW: 15.4 % — ABNORMAL HIGH (ref 11.0–15.0)
WBC: 8.4 10*3/uL (ref 3.8–10.8)

## 2018-12-11 LAB — COMPREHENSIVE METABOLIC PANEL
AG Ratio: 1.2 (calc) (ref 1.0–2.5)
ALT: 14 U/L (ref 6–29)
AST: 15 U/L (ref 10–30)
Albumin: 4.1 g/dL (ref 3.6–5.1)
Alkaline phosphatase (APISO): 56 U/L (ref 31–125)
BUN: 11 mg/dL (ref 7–25)
CO2: 28 mmol/L (ref 20–32)
Calcium: 9.5 mg/dL (ref 8.6–10.2)
Chloride: 103 mmol/L (ref 98–110)
Creat: 0.69 mg/dL (ref 0.50–1.10)
Globulin: 3.4 g/dL (calc) (ref 1.9–3.7)
Glucose, Bld: 86 mg/dL (ref 65–99)
Potassium: 4.1 mmol/L (ref 3.5–5.3)
Sodium: 139 mmol/L (ref 135–146)
Total Bilirubin: 0.6 mg/dL (ref 0.2–1.2)
Total Protein: 7.5 g/dL (ref 6.1–8.1)

## 2018-12-11 LAB — HEPATITIS B E ANTIBODY: Hep B E Ab: NONREACTIVE

## 2018-12-11 LAB — HEPATITIS B E ANTIGEN: Hep B E Ag: REACTIVE — AB

## 2018-12-11 LAB — HEPATITIS B DNA, ULTRAQUANTITATIVE, PCR
Hepatitis B DNA (Calc): <1 Log IU/mL
Hepatitis B DNA: <10 IU/mL

## 2019-01-07 MED FILL — VEMLIDY 25 MG TABLET: 25 | 30 days supply | Qty: 30 | Fill #1

## 2019-02-09 MED FILL — VEMLIDY 25 MG TABLET: 25 | 30 days supply | Qty: 30 | Fill #2

## 2019-03-09 MED FILL — VEMLIDY 25 MG TABLET: 25 | 30 days supply | Qty: 30 | Fill #3

## 2019-04-09 MED FILL — VEMLIDY 25 MG TABLET: 25 | 30 days supply | Qty: 30 | Fill #4

## 2019-05-06 ENCOUNTER — Telehealth: Payer: Self-pay | Admitting: Pharmacy Technician

## 2019-05-06 NOTE — Telephone Encounter (Signed)
RCID Patient Advocate Encounter  Received a summary of benefits notification from [name] via fax indicating that the patient now has active insurance coverage.  Payer: [employer] ID: [id number] Phone: [phone]  Retail copay amount estimated at $20.00

## 2019-05-07 MED FILL — VEMLIDY 25 MG TABLET: 25 | 30 days supply | Qty: 30 | Fill #5

## 2019-05-19 NOTE — Telephone Encounter (Signed)
RCID Patient Advocate Encounter  Received notification from [facility] Advancing Access that the patient's enrollment period has ended, effective 05/18/2019.

## 2019-06-02 ENCOUNTER — Encounter: Payer: Self-pay | Admitting: Internal Medicine

## 2019-06-02 ENCOUNTER — Ambulatory Visit: Payer: BLUE CROSS/BLUE SHIELD | Admitting: Internal Medicine

## 2019-06-02 ENCOUNTER — Other Ambulatory Visit: Payer: Self-pay

## 2019-06-02 DIAGNOSIS — B181 Chronic viral hepatitis B without delta-agent: Secondary | ICD-10-CM | POA: Diagnosis not present

## 2019-06-02 NOTE — Progress Notes (Signed)
         [place] for Infectious Disease  Patient Active Problem List   Diagnosis Date Noted  . 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 IUD check up 04/11/2015  . Encounter for removal and reinsertion of IUD 03/28/2015  . Well woman exam with routine gynecological exam 12/01/2012  . Routine health maintenance 11/05/2012  . OVERWEIGHT 02/07/2009    Patient's Medications  New Prescriptions   No medications on file  Previous Medications   FLUTICASONE (FLONASE) 50 MCG/ACT NASAL SPRAY    Place 1 spray into both nostrils daily as needed.    TENOFOVIR ALAFENAMIDE FUMARATE (VEMLIDY) 25 MG TABS    Take 1 tablet (25 mg total) by mouth daily. with food  Modified Medications   No medications on file  Discontinued Medications   No medications on file    Subjective: Brenda Brady is in for her routine follow-up visit.  She has had no problems obtaining, taking or tolerating her Vemlidy.  She recently got a new [employer].  She denies missing any doses.  She is feeling well.    Review of Systems: Review of Systems  Constitutional: Negative for chills, diaphoresis, fever, malaise/fatigue and weight loss.  Gastrointestinal: Negative for abdominal pain, constipation, diarrhea, nausea and vomiting.  Psychiatric/Behavioral: Negative for depression.    Past Medical History:  Diagnosis Date  . Hepatitis B infection   . Hepatitis B, chronic ([place])   . Hepatitis B, chronic ([facility])     Social History   Tobacco Use  . Smoking status: Never Smoker  . Smokeless tobacco: Never Used  Substance Use Topics  . Alcohol use: No  . Drug use: No    History reviewed. No pertinent family history.  No Known Allergies  Objective: Vitals:   06/02/19 0925  BP: 134/86  Pulse: 92  Temp: 98.1 F (36.7 C)  SpO2: 98%  Weight: 137 lb (62.1 kg)   Body mass index is 29.65 kg/m.  Physical Exam Constitutional:      Comments: She is in good  spirits.  She was examined with the aid of the interpreter.  Abdominal:     General: There is no distension.     Palpations: Abdomen is soft. There is no mass.     Tenderness: There is no abdominal tenderness.  Skin:    Findings: No rash.  Neurological:     Mental Status: She is alert and oriented to person, place, and time.     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  She is concerned about co-pays with her current insurance.  I will wait and do repeat blood work before a visit in 6 months.      Relevant Orders   Hepatitis B DNA, ultraquantitative, PCR   Comprehensive metabolic panel       Michel Bickers, MD [facility] for [place] [phone] pager   [phone] cell 06/02/2019, 9:58 AM

## 2019-06-02 NOTE — Assessment & Plan Note (Signed)
Her adherence with Shirlee Latch is perfect and her infection has been under excellent control.  She is concerned about co-pays with her current insurance.  I will wait and do repeat blood work before a visit in 6 months.

## 2019-06-05 ENCOUNTER — Telehealth: Payer: Self-pay | Admitting: Pharmacy Technician

## 2019-06-05 MED FILL — VEMLIDY 25 MG TABLET: 25 | 30 days supply | Qty: 30 | Fill #6

## 2019-06-05 NOTE — Telephone Encounter (Signed)
RCID Patient Advocate Encounter    Findings of the benefits investigation:   Insurance: [place] now active Copay amount: $611  [place] copay card to cover    Bartholomew Crews, [place] Patient [facility] for Infectious Disease Phone: [phone] Fax: [phone] 06/05/2019 11:41 AM

## 2019-07-02 MED FILL — VEMLIDY 25 MG TABLET: 25 | 30 days supply | Qty: 30 | Fill #7

## 2019-08-04 MED FILL — VEMLIDY 25 MG TABLET: 25 | 30 days supply | Qty: 30 | Fill #8

## 2019-09-06 ENCOUNTER — Other Ambulatory Visit: Payer: Self-pay

## 2019-09-06 ENCOUNTER — Ambulatory Visit (HOSPITAL_COMMUNITY)
Admission: EM | Admit: 2019-09-06 | Discharge: 2019-09-06 | Disposition: A | Discharge status: Home / Self Care | Payer: 59 | Attending: Physician Assistant | Admitting: Physician Assistant

## 2019-09-06 ENCOUNTER — Encounter (HOSPITAL_COMMUNITY): Payer: Self-pay

## 2019-09-06 DIAGNOSIS — Z6837 Body mass index (BMI) 37.0-37.9, adult: Secondary | ICD-10-CM | POA: Insufficient documentation

## 2019-09-06 DIAGNOSIS — Z79899 Other long term (current) drug therapy: Secondary | ICD-10-CM | POA: Insufficient documentation

## 2019-09-06 DIAGNOSIS — U071 COVID-19: Secondary | ICD-10-CM | POA: Insufficient documentation

## 2019-09-06 DIAGNOSIS — B181 Chronic viral hepatitis B without delta-agent: Secondary | ICD-10-CM | POA: Diagnosis not present

## 2019-09-06 DIAGNOSIS — B349 Viral infection, unspecified: Secondary | ICD-10-CM

## 2019-09-06 DIAGNOSIS — E663 Overweight: Secondary | ICD-10-CM | POA: Diagnosis not present

## 2019-09-06 DIAGNOSIS — Z975 Presence of (intrauterine) contraceptive device: Secondary | ICD-10-CM | POA: Insufficient documentation

## 2019-09-06 MED ORDER — CEPACOL SORE THROAT 5.4 MG MT LOZG
1.0000 | LOZENGE | OROMUCOSAL | 0 refills | Status: DC | PRN
Start: 2019-09-06 — End: 2019-10-21

## 2019-09-06 MED ORDER — FLUTICASONE PROPIONATE 50 MCG/ACT NA SUSP: 1.0000 | Freq: Every day | NASAL | 0 refills | Status: DC

## 2019-09-06 MED ORDER — BENZONATATE 100 MG PO CAPS: 100.0000 mg | ORAL_CAPSULE | Freq: Three times a day (TID) | ORAL | 0 refills | Status: AC | PRN

## 2019-09-06 MED ORDER — ONDANSETRON HCL 4 MG PO TABS: 4.0000 mg | ORAL_TABLET | Freq: Three times a day (TID) | ORAL | 0 refills | Status: DC | PRN

## 2019-09-06 MED ORDER — BENZONATATE 100 MG PO CAPS: 100.0000 mg | ORAL_CAPSULE | Freq: Three times a day (TID) | ORAL | 0 refills | Status: DC | PRN

## 2019-09-06 NOTE — ED Provider Notes (Signed)
[place]    CSN: [id number] Arrival date & time: 09/06/19  1546      History   Chief Complaint Chief Complaint  Patient presents with  . COVID symptoms    HPI Brenda Brady is a 44 y.o. female.   Patient presents for 1 day onset of sore throat, nasal congestion, cough, body ache and fever.  He reports symptoms started with sore throat and nasal congestion yesterday.  She developed a slight cough.  She has had some yellow sputum.  She reports fever up to 101.  She is been taking Tylenol and ibuprofen.  She denies shortness of breath.  She also endorses a few episodes of vomiting today.  No blood in this.  She has had some diarrhea as well.  No blood in the diarrhea.  Some generalized abdominal cramping.  She reports she has been able to keep down some water today.  She reports her child had similar symptoms leading up to this.  No Covid testing.  Denies no other reported symptoms.     Past Medical History:  Diagnosis Date  . Hepatitis B infection   . Hepatitis B, chronic ([place])   . Hepatitis B, chronic [facility])     Patient Active Problem List   Diagnosis Date Noted  . IUD check up 04/11/2015  . Encounter for removal and reinsertion of IUD 03/28/2015  . Well woman exam with routine gynecological exam 12/01/2012  . Routine health maintenance 11/05/2012  . 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 OVERWEIGHT 02/07/2009    Past Surgical History:  Procedure Laterality Date  . VAGINAL DELIVERY      OB History   No obstetric history on file.      Home Medications    Prior to Admission medications   Medication Sig Start Date End Date Taking? Authorizing Provider  benzonatate (TESSALON) 100 MG capsule Take 1-2 capsules (100-200 mg total) by mouth 3 (three) times daily as needed for up to 10 days for cough. 09/06/19 09/16/19  Davari Lopes, Marguerita Beards, Aqsa-C  fluticasone (FLONASE) 50 MCG/ACT nasal spray Place 1 spray into both nostrils daily. 09/06/19   Honor Frison, Marguerita Beards, Shacarra-C    Menthol (CEPACOL SORE THROAT) 5.4 MG LOZG Use as directed 1 lozenge (5.4 mg total) in the mouth or throat every 2 (two) hours as needed. 09/06/19   Enzo Treu, Marguerita Beards, Mimie-C  ondansetron (ZOFRAN) 4 MG tablet Take 1 tablet (4 mg total) by mouth every 8 (eight) hours as needed for nausea or vomiting. 09/06/19   Trana Ressler, Marguerita Beards, Anahy-C  Tenofovir Alafenamide Fumarate (VEMLIDY) 25 MG TABS Take 1 tablet (25 mg total) by mouth daily. with food 12/02/18   Michel Bickers, MD    Family History No family history on file.  Social History Social History   Tobacco Use  . Smoking status: Never Smoker  . Smokeless tobacco: Never Used  Substance Use Topics  . Alcohol use: No  . Drug use: No     Allergies   Patient has no known allergies.   Review of Systems Review of Systems   Physical Exam Triage Vital Signs ED Triage Vitals [09/06/19 1725]  Enc Vitals Group     BP 120/82     Pulse Rate 87     Resp 18     Temp 99.9 F (37.7 C)     Temp Source Oral     SpO2      Weight 138 lb (62.6 kg)  Height 4\' 9"  (1.448 m)     Head Circumference      Peak Flow      Pain Score 0     Pain Loc      Pain Edu?      Excl. in [place]?    No data found.  Updated Vital Signs BP 120/82   Pulse 87   Temp 99.9 F (37.7 C) (Oral)   Resp 18   Ht 4\' 9"  (1.448 m)   Wt 138 lb (62.6 kg)   BMI 29.86 kg/m   Visual Acuity Right Eye Distance:   Left Eye Distance:   Bilateral Distance:    Right Eye Near:   Left Eye Near:    Bilateral Near:     Physical Exam Vitals and nursing note reviewed.  Constitutional:      General: She is not in acute distress.    Appearance: She is well-developed. She is ill-appearing.  HENT:     Head: Normocephalic and atraumatic.     Nose: Congestion and rhinorrhea present.     Mouth/Throat:     Mouth: Mucous membranes are moist.     Comments: Postnasal drip Eyes:     Conjunctiva/sclera: Conjunctivae normal.  Cardiovascular:     Rate and Rhythm: Normal rate and regular  rhythm.     Heart sounds: No murmur heard.   Pulmonary:     Effort: Pulmonary effort is normal. No respiratory distress.     Breath sounds: Normal breath sounds. No wheezing, rhonchi or rales.  Abdominal:     Palpations: Abdomen is soft.     Tenderness: There is no abdominal tenderness.  Musculoskeletal:     Cervical back: Neck supple.     Right lower leg: No edema.     Left lower leg: No edema.  Lymphadenopathy:     Cervical: No cervical adenopathy.  Skin:    General: Skin is warm and dry.     Findings: No rash.  Neurological:     General: No focal deficit present.     Mental Status: She is alert and oriented to person, place, and time.      UC Treatments / Results  Labs (all labs ordered are listed, but only abnormal results are displayed) Labs Reviewed  SARS CORONAVIRUS 2 (TAT 6-24 HRS)    EKG   Radiology No results found.  Procedures Procedures (including critical care time)  Medications Ordered in UC Medications - No data to display  Initial Impression / Assessment and Plan / UC Course  I have reviewed the triage vital signs and the nursing notes.  Pertinent labs & imaging results that were available during my care of the patient were reviewed by me and considered in my medical decision making (see chart for details).     #Viral illness Patient is a 44 year old presenting with viral syndrome.  Suspicious for Covid given GI and respiratory involvement.  Low-grade temp here otherwise normal vital signs.  Reassuring exam.  We will treat her symptomatically.  Strict return and follow-up precautions discussed.  Emergency department cautions discussed.  Patient verbalized understanding plan of care. Final Clinical Impressions(s) / UC Diagnoses   Final diagnoses:  Viral illness     Discharge Instructions     Take the medicines as prescribed -Tessalon for cough -Zofran for nausea and vomiting -Tessalon for cough -Continue Tylenol and ibuprofen as she has  been -Flonase daily  If you not improving in the next week return.  If significantly worsening with  shortness of breath, unable to tolerate fluids or anything by mouth or greater than 24 hours, return sooner or go to the emergency department  If your Covid-19 test is positive, you will receive a phone call from [facility] regarding your results. Negative test results are not called. Both positive and negative results area always visible on MyChart. If you do not have a MyChart account, sign up instructions are in your discharge papers.   Persons who are directed to care for themselves at home may discontinue isolation under the following conditions:  . At least 10 days have passed since symptom onset and . At least 24 hours have passed without running a fever (this means without the use of fever-reducing medications) and . Other symptoms have improved.  Persons infected with COVID-19 who never develop symptoms may discontinue isolation and other precautions 10 days after the date of their first positive COVID-19 test.      ED Prescriptions    Medication Sig Dispense Auth. Provider   ondansetron (ZOFRAN) 4 MG tablet Take 1 tablet (4 mg total) by mouth every 8 (eight) hours as needed for nausea or vomiting. 8 tablet Valissa Lyvers, Marguerita Beards, Lurdes-C   benzonatate (TESSALON) 100 MG capsule  (Status: Discontinued) Take 1-2 capsules (100-200 mg total) by mouth 3 (three) times daily as needed for up to 10 days for cough. 60 capsule Sonya Gunnoe, Marguerita Beards, Barbette-C   Menthol (CEPACOL SORE THROAT) 5.4 MG LOZG Use as directed 1 lozenge (5.4 mg total) in the mouth or throat every 2 (two) hours as needed. 30 lozenge Raun Routh, Marguerita Beards, Ruthanne-C   fluticasone (FLONASE) 50 MCG/ACT nasal spray Place 1 spray into both nostrils daily. 15.8 mL Lashona Schaaf, Marguerita Beards, Zaliyah-C   benzonatate (TESSALON) 100 MG capsule Take 1-2 capsules (100-200 mg total) by mouth 3 (three) times daily as needed for up to 10 days for cough. 60 capsule Raheim Beutler, Marguerita Beards, Ivan-C       PDMP not reviewed this encounter.   Purnell Shoemaker, Nylan-C 09/06/19 1747

## 2019-09-06 NOTE — Discharge Instructions (Addendum)
Take the medicines as prescribed -Tessalon for cough -Zofran for nausea and vomiting -Tessalon for cough -Continue Tylenol and ibuprofen as she has been -Flonase daily  If you not improving in the next week return.  If significantly worsening with shortness of breath, unable to tolerate fluids or anything by mouth or greater than 24 hours, return sooner or go to the emergency department  If your Covid-19 test is positive, you will receive a phone call from [facility] regarding your results. Negative test results are not called. Both positive and negative results area always visible on MyChart. If you do not have a MyChart account, sign up instructions are in your discharge papers.   Persons who are directed to care for themselves at home may discontinue isolation under the following conditions:   At least 10 days have passed since symptom onset and  At least 24 hours have passed without running a fever (this means without the use of fever-reducing medications) and  Other symptoms have improved.  Persons infected with COVID-19 who never develop symptoms may discontinue isolation and other precautions 10 days after the date of their first positive COVID-19 test.

## 2019-09-06 NOTE — ED Triage Notes (Signed)
Pt c/o productive cough w/yellow mucous, vomiting, chills, fever, chest congestion started yesterday.

## 2019-09-07 LAB — SARS CORONAVIRUS 2 (TAT 6-24 HRS): SARS Coronavirus 2: POSITIVE — AB

## 2019-09-07 MED FILL — VEMLIDY 25 MG TABLET: 25 | 30 days supply | Qty: 30 | Fill #9

## 2019-09-11 ENCOUNTER — Encounter: Payer: Self-pay | Admitting: Physician Assistant

## 2019-09-11 ENCOUNTER — Other Ambulatory Visit: Payer: Self-pay | Admitting: Physician Assistant

## 2019-09-11 DIAGNOSIS — B181 Chronic viral hepatitis B without delta-agent: Secondary | ICD-10-CM

## 2019-09-11 DIAGNOSIS — Z6827 Body mass index (BMI) 27.0-27.9, adult: Secondary | ICD-10-CM

## 2019-09-11 DIAGNOSIS — U071 COVID-19: Secondary | ICD-10-CM

## 2019-09-11 MED ORDER — SODIUM CHLORIDE 0.9 % IV SOLN
1200.0000 mg | Freq: Once | INTRAVENOUS | Status: AC
  Administered 2019-09-12: 1200 mg via INTRAVENOUS
  Filled 2019-09-11: qty 1200

## 2019-09-11 NOTE — Progress Notes (Signed)
I connected by phone with Brenda Brady on 09/11/2019 at 8:00 AM to discuss the potential use of a new treatment for mild to moderate COVID-19 viral infection in non-hospitalized patients.  This patient is a 44 y.o. female that meets the FDA criteria for Emergency Use Authorization of COVID monoclonal antibody casirivimab/imdevimab.  Has a (+) direct SARS-CoV-2 viral test result  Has mild or moderate COVID-19   Is NOT hospitalized due to COVID-19  Is within 10 days of symptom onset  Has at least one of the high risk factor(s) for progression to severe COVID-19 and/or hospitalization as defined in EUA.  Specific high risk criteria : BMI > 25, chronic hep B   I have spoken and communicated the following to the patient or parent/caregiver regarding COVID monoclonal antibody treatment:  1. FDA has authorized the emergency use for the treatment of mild to moderate COVID-19 in adults and pediatric patients with positive results of direct SARS-CoV-2 viral testing who are 42 years of age and older weighing at least 40 kg, and who are at high risk for progressing to severe COVID-19 and/or hospitalization.  2. The significant known and potential risks and benefits of COVID monoclonal antibody, and the extent to which such potential risks and benefits are unknown.  3. Information on available alternative treatments and the risks and benefits of those alternatives, including clinical trials.  4. Patients treated with COVID monoclonal antibody should continue to self-isolate and use infection control measures (e.g., wear mask, isolate, social distance, avoid sharing personal items, clean and disinfect "high touch" surfaces, and frequent handwashing) according to CDC guidelines.   5. The patient or parent/caregiver has the option to accept or refuse COVID monoclonal antibody treatment.  After reviewing this information with the patient, The patient agreed to proceed with receiving casirivimab\imdevimab  infusion and will be provided a copy of the Fact sheet prior to receiving the infusion.  Sx onset 7/31 . Set up for infusion on 8/7 @ 8:30am. Directions given to [facility]. Pt is aware that insurance will be charged an infusion fee.   [name] Form 09/11/2019 8:00 AM

## 2019-09-12 ENCOUNTER — Ambulatory Visit (HOSPITAL_COMMUNITY)
Admission: RE | Admit: 2019-09-12 | Discharge: 2019-09-12 | Disposition: A | Discharge status: Home / Self Care | Payer: 59 | Source: Ambulatory Visit | Attending: Pulmonary Disease | Admitting: Pulmonary Disease

## 2019-09-12 DIAGNOSIS — U071 COVID-19: Secondary | ICD-10-CM | POA: Diagnosis present

## 2019-09-12 DIAGNOSIS — Z6827 Body mass index (BMI) 27.0-27.9, adult: Secondary | ICD-10-CM

## 2019-09-12 DIAGNOSIS — B181 Chronic viral hepatitis B without delta-agent: Secondary | ICD-10-CM

## 2019-09-12 MED ORDER — METHYLPREDNISOLONE SODIUM SUCC 125 MG IJ SOLR: 125.0000 mg | Freq: Once | INTRAMUSCULAR | Status: DC | PRN

## 2019-09-12 MED ORDER — DIPHENHYDRAMINE HCL 50 MG/ML IJ SOLN: 50.0000 mg | Freq: Once | INTRAMUSCULAR | Status: DC | PRN

## 2019-09-12 MED ORDER — FAMOTIDINE IN NACL 20-0.9 MG/50ML-% IV SOLN: 20.0000 mg | Freq: Once | INTRAVENOUS | Status: DC | PRN

## 2019-09-12 MED ORDER — EPINEPHRINE 0.3 MG/0.3ML IJ SOAJ: 0.3000 mg | Freq: Once | INTRAMUSCULAR | Status: DC | PRN

## 2019-09-12 MED ORDER — SODIUM CHLORIDE 0.9 % IV SOLN: INTRAVENOUS | Status: DC | PRN

## 2019-09-12 MED ORDER — ALBUTEROL SULFATE HFA 108 (90 BASE) MCG/ACT IN AERS: 2.0000 | INHALATION_SPRAY | Freq: Once | RESPIRATORY_TRACT | Status: DC | PRN

## 2019-09-12 NOTE — Discharge Instructions (Signed)

## 2019-09-12 NOTE — Progress Notes (Signed)
  Diagnosis: COVID-19  Physician: Dr Joya Gaskins  Procedure: Covid Infusion Clinic Med: casirivimab\imdevimab infusion - Provided patient with casirivimab\imdevimab fact sheet for patients, parents and caregivers prior to infusion.  Complications: No immediate complications noted.  Discharge: Discharged home   [place], [place] 09/12/2019

## 2019-10-07 MED FILL — VEMLIDY 25 MG TABLET: 25 | 30 days supply | Qty: 30 | Fill #10

## 2019-10-20 NOTE — Patient Instructions (Addendum)
It was nice seeing you today, Brenda Brady.  You were here for a physical.  I am glad you got your flu shot and tetanus shot. Please get your covid vaccine at 3 months or earlier if the other doctor says it is OK.  I have scheduled you an appointment next Thursday, 9/23 at 10:00am for pap smear and IUD removal.  I have sent in a steroid cream for itching. If this does not improve, I can send you to get a biopsy.  Stay well, Brenda Button, MD

## 2019-10-20 NOTE — Progress Notes (Signed)
° ° °  SUBJECTIVE:   CHIEF COMPLAINT / HPI: Physical  Has been seeing Sandi Mariscal, MD as PCP; however, patient states her insurance no longer covers his practice.  Recent COVID-19 infection Seen at urgent care 09/06/19. Today, reports symptoms have resolved.  States she was told by a physician at the infusion center that she had to wait 3 months before getting the Covid vaccine.  Tick bite Patient reports history of tick bite 1 year ago on her right hip area.  She did see the tick and removed it from her body.  She states the bump has persisted since then and has been pruritic.  No pain.  States she was prescribed a cream for itching by her PCP, but does not remember the name of the cream. Denies fever or rash at the time of tick bite.  Health maintenance Last pap smear 2014 per chart. Agreeable to come back next week to have this done. Due for COVID vaccine, flu vaccine, and Tdap.  Agreeable to get flu shot and Tdap today. Patient also requesting to have IUD removed.  Had the Mirena placed on 03/28/2015 here.  Agreeable to come back next week to have IUD removed along with Pap smear.  Social history Lives at home with husband and 3 children.  Reports feeling safe at home. Reports eating a lot of rice and does eat fruits and vegetables. No formal exercise, feels not enough time to exercise. Works at a [employer].  PERTINENT  PMH / PSH: Chronic hepatitis B  OBJECTIVE:   BP 120/75    Pulse 96    Ht 4' 10.27" (1.48 m)    Wt 138 lb 6.4 oz (62.8 kg)    SpO2 98%    BMI 28.66 kg/m   General: Overweight female, NAD HEENT: PERRL, EOMI, MMM, posterior oropharynx clear, TMs clear bilaterally Neck: supple, no LAD CV: RRR, no murmurs Pulm: CTAB, no wheezes or rales Abd: soft, non-tender, +BS Skin: see below    ASSESSMENT/PLAN:    Health maintenance - flu and Tdap vaccine given - patient prefers to wait 3 months from 8/1 to get covid vaccine - return next week 9/23 for pap smear and IUD  removal - Patient encouraged to walk more for exercise  Tick bite Chronic, tick bite about 1 year ago. Still with pruritis. Does not appear to be actively infected. No symptoms of Lyme disease following initial tick bite. - hydrocortisone cream - consider derm clinic referral for biopsy if not improving  [name], MD [place]

## 2019-10-21 ENCOUNTER — Other Ambulatory Visit: Payer: Self-pay

## 2019-10-21 ENCOUNTER — Ambulatory Visit (INDEPENDENT_AMBULATORY_CARE_PROVIDER_SITE_OTHER): Payer: 59 | Admitting: Family Medicine

## 2019-10-21 ENCOUNTER — Encounter: Payer: Self-pay | Admitting: Family Medicine

## 2019-10-21 VITALS — BP 120/75 | HR 96 | Ht 58.27 in | Wt 138.4 lb

## 2019-10-21 DIAGNOSIS — Z0000 Encounter for general adult medical examination without abnormal findings: Secondary | ICD-10-CM

## 2019-10-21 DIAGNOSIS — W57XXXS Bitten or stung by nonvenomous insect and other nonvenomous arthropods, sequela: Secondary | ICD-10-CM | POA: Diagnosis not present

## 2019-10-21 DIAGNOSIS — Z23 Encounter for immunization: Secondary | ICD-10-CM | POA: Diagnosis not present

## 2019-10-21 DIAGNOSIS — S70261S Insect bite (nonvenomous), right hip, sequela: Secondary | ICD-10-CM

## 2019-10-21 MED ORDER — HYDROCORTISONE 2.5 % EX CREA: TOPICAL_CREAM | Freq: Two times a day (BID) | CUTANEOUS | 0 refills | Status: DC

## 2019-10-29 ENCOUNTER — Encounter: Payer: Self-pay | Admitting: Family Medicine

## 2019-10-29 ENCOUNTER — Ambulatory Visit (INDEPENDENT_AMBULATORY_CARE_PROVIDER_SITE_OTHER): Payer: 59 | Admitting: Family Medicine

## 2019-10-29 ENCOUNTER — Other Ambulatory Visit: Payer: Self-pay

## 2019-10-29 ENCOUNTER — Other Ambulatory Visit (HOSPITAL_COMMUNITY)
Admission: RE | Admit: 2019-10-29 | Discharge: 2019-10-29 | Disposition: A | Discharge status: Home / Self Care | Payer: 59 | Source: Ambulatory Visit | Attending: Family Medicine | Admitting: Family Medicine

## 2019-10-29 VITALS — BP 110/76 | HR 97 | Wt 137.0 lb

## 2019-10-29 DIAGNOSIS — Z30011 Encounter for initial prescription of contraceptive pills: Secondary | ICD-10-CM | POA: Diagnosis not present

## 2019-10-29 DIAGNOSIS — Z30432 Encounter for removal of intrauterine contraceptive device: Secondary | ICD-10-CM

## 2019-10-29 DIAGNOSIS — Z124 Encounter for screening for malignant neoplasm of cervix: Secondary | ICD-10-CM | POA: Insufficient documentation

## 2019-10-29 MED ORDER — NORGESTIM-ETH ESTRAD TRIPHASIC 0.18/0.215/0.25 MG-35 MCG PO TABS: 1.0000 | ORAL_TABLET | Freq: Every day | ORAL | 11 refills | Status: DC

## 2019-10-29 NOTE — Progress Notes (Signed)
    SUBJECTIVE:   CHIEF COMPLAINT / HPI: Pap smear, IUD removal  Patient here requesting IUD removal, interested in starting OCPs.  Denies smoking history and denies personal and family history of blood clots.  PERTINENT  PMH / PSH: Chronic hepatitis B  OBJECTIVE:   Pulse 97   Wt 137 lb (62.1 kg)   LMP 08/28/2019 (LMP Unknown)   SpO2 97%   BMI 28.37 kg/m   General: Overweight woman, NAD GU: cervix with minimal friability, no lesions seen  ASSESSMENT/PLAN:   Pap smear Pap smear with GC/chlamydia swab performed today in conjunction with IUD removal.  Performed under the guidance of Guillermina [place], MD.  IUD removal IUD removed without any complications.  Contraception Patient requesting to start on OCP.  Started on Ortho Tri-Cyclen.  Counseled patient on risk of blood clot, hypertension, and abnormal uterine bleeding.  Follow-up in 1 year for annual physical or sooner as needed  [name], MD [place]

## 2019-10-29 NOTE — Patient Instructions (Addendum)
It was nice seeing you today, Brenda Brady.  Today, we performed your pap smear and removed your IUD. I have started you on birth control pills, which you will need to take every day.  The risks of birth control include blood clots, increase in blood pressure, and abnormal bleeding for the first few months.  Please give [name] a call if there are any issues with your birth control pills. I will update you with the results.  Stay well, [name], MD

## 2019-10-30 ENCOUNTER — Encounter: Payer: Self-pay | Admitting: Family Medicine

## 2019-10-30 LAB — CYTOLOGY - PAP
Chlamydia: NEGATIVE
Comment: NEGATIVE
Comment: NEGATIVE
Comment: NEGATIVE
Comment: NORMAL
Diagnosis: NEGATIVE
High risk HPV: NEGATIVE
Neisseria Gonorrhea: NEGATIVE
Trichomonas: NEGATIVE

## 2019-11-03 MED FILL — VEMLIDY 25 MG TABLET: 25 | 30 days supply | Qty: 30 | Fill #11

## 2019-11-26 ENCOUNTER — Other Ambulatory Visit: Payer: Self-pay | Admitting: Internal Medicine

## 2019-11-26 DIAGNOSIS — B181 Chronic viral hepatitis B without delta-agent: Secondary | ICD-10-CM

## 2019-12-01 MED FILL — VEMLIDY 25 MG TABLET: 25 | 30 days supply | Qty: 30 | Fill #0

## 2019-12-02 ENCOUNTER — Encounter: Payer: Self-pay | Admitting: Internal Medicine

## 2019-12-02 ENCOUNTER — Other Ambulatory Visit: Payer: Self-pay

## 2019-12-02 ENCOUNTER — Ambulatory Visit (INDEPENDENT_AMBULATORY_CARE_PROVIDER_SITE_OTHER): Payer: 59 | Admitting: Internal Medicine

## 2019-12-02 DIAGNOSIS — U071 COVID-19: Secondary | ICD-10-CM | POA: Insufficient documentation

## 2019-12-02 DIAGNOSIS — B181 Chronic viral hepatitis B without delta-agent: Secondary | ICD-10-CM

## 2019-12-02 MED ORDER — VEMLIDY 25 MG PO TABS: 1.0000 | ORAL_TABLET | Freq: Every day | ORAL | 11 refills | Status: DC

## 2019-12-02 NOTE — Assessment & Plan Note (Signed)
Her adherence with Vemlidy remains excellent. Her hepatitis B infection has been under excellent control since starting Vemlidy therapy many years ago. She will get repeat lab work today, continue [place] and follow-up in 1 year.

## 2019-12-02 NOTE — Progress Notes (Signed)
[facility] for Infectious Disease  Patient Active Problem List   Diagnosis Date Noted  . 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 COVID-19 12/02/2019  . OVERWEIGHT 02/07/2009    Patient's Medications  New Prescriptions   No medications on file  Previous Medications   FLUTICASONE (FLONASE) 50 MCG/ACT NASAL SPRAY    Place 1 spray into both nostrils daily.   HYDROCORTISONE 2.5 % CREAM    Apply topically 2 (two) times daily.   NORGESTIMATE-ETHINYL ESTRADIOL TRIPHASIC (ORTHO TRI-CYCLEN, 28,) 0.18/0.215/0.25 MG-35 MCG TABLET    Take 1 tablet by mouth daily.  Modified Medications   Modified Medication Previous Medication   TENOFOVIR ALAFENAMIDE FUMARATE (VEMLIDY) 25 MG TABS VEMLIDY 25 MG TABS      Take 1 tablet (25 mg total) by mouth daily. with food    TAKE 1 TABLET (25 MG TOTAL) BY MOUTH DAILY WITH FOOD  Discontinued Medications   No medications on file    Subjective: Brenda Brady is in for her routine follow-up visit.  She has had no problems obtaining, taking or tolerating her Vemlidy. She recently missed 1 dose when her refills ran out. She had very mild COVID-19 infection and August. She received a monoclonal antibody and fusion. All symptoms resolved after 4 days. She has heard that if she received a Covid vaccine after having Covid infection that it could cause death. She is feeling well.    Review of Systems: Review of Systems  Constitutional: Negative for chills, diaphoresis, fever, malaise/fatigue and weight loss.  Gastrointestinal: Negative for abdominal pain, constipation, diarrhea, nausea and vomiting.  Psychiatric/Behavioral: Negative for depression.    Past Medical History:  Diagnosis Date  . BMI 29.0-29.9,adult   . Hepatitis B infection   . Hepatitis B, chronic ([place])   . Hepatitis B, chronic (HCC)     Social History   Tobacco Use  . Smoking status: Never Smoker  . Smokeless tobacco: Never Used  Substance Use Topics  .  Alcohol use: No  . Drug use: No    No family history on file.  No Known Allergies  Objective: Vitals:   12/02/19 0947  BP: 125/79  Pulse: 82  SpO2: 100%  Weight: 138 lb (62.6 kg)   Body mass index is 28.58 kg/m.  Physical Exam Constitutional:      Comments: She is in good spirits.  She was examined with the aid of the interpreter.  Abdominal:     General: There is no distension.     Palpations: Abdomen is soft. There is no mass.     Tenderness: There is no abdominal tenderness.  Skin:    Findings: No rash.  Neurological:     Mental Status: She is alert and oriented to person, place, and time.     Lab Results 11/2018 Liver enzymes normal Hepatitis B e antigen positive Hepatitis B e antibody negative Hepatitis B viral load less than 10  Problem List Items Addressed This Visit      High   Chronic active viral hepatitis B ([name])    Her adherence with Vemlidy remains excellent. Her hepatitis B infection has been under excellent control since starting Vemlidy therapy many years ago. She will get repeat lab work today, continue [place] and follow-up in 1 year.      Relevant Medications   Tenofovir Alafenamide Fumarate (VEMLIDY) 25 MG TABS   Other Relevant Orders   Hepatitis B DNA, ultraquantitative,  PCR   Hepatitis B e antibody   Hepatitis B e antigen   Comprehensive metabolic panel     Unprioritized   COVID-19    Her that current Covid vaccines have proven to be quite safe and effective. I recommended that she start vaccination next month.      Relevant Medications   Tenofovir Alafenamide Fumarate (VEMLIDY) 25 MG TABS       Michel Bickers, MD [facility] for Infectious [place] [phone] pager   [phone] cell 12/02/2019, 10:07 AM

## 2019-12-02 NOTE — Assessment & Plan Note (Signed)
Her that current Covid vaccines have proven to be quite safe and effective. I recommended that she start vaccination next month.

## 2019-12-05 LAB — COMPREHENSIVE METABOLIC PANEL
AG Ratio: 1.2 (calc) (ref 1.0–2.5)
ALT: 23 U/L (ref 6–29)
AST: 17 U/L (ref 10–30)
Albumin: 4.1 g/dL (ref 3.6–5.1)
Alkaline phosphatase (APISO): 56 U/L (ref 31–125)
BUN: 10 mg/dL (ref 7–25)
CO2: 28 mmol/L (ref 20–32)
Calcium: 9.2 mg/dL (ref 8.6–10.2)
Chloride: 104 mmol/L (ref 98–110)
Creat: 0.68 mg/dL (ref 0.50–1.10)
Globulin: 3.5 g/dL (calc) (ref 1.9–3.7)
Glucose, Bld: 116 mg/dL — ABNORMAL HIGH (ref 65–99)
Potassium: 3.9 mmol/L (ref 3.5–5.3)
Sodium: 138 mmol/L (ref 135–146)
Total Bilirubin: 0.6 mg/dL (ref 0.2–1.2)
Total Protein: 7.6 g/dL (ref 6.1–8.1)

## 2019-12-05 LAB — HEPATITIS B DNA, ULTRAQUANTITATIVE, PCR
Hepatitis B DNA (Calc): <1 Log IU/mL — ABNORMAL HIGH
Hepatitis B DNA: <10 IU/mL — ABNORMAL HIGH

## 2019-12-05 LAB — HEPATITIS B E ANTIBODY: Hep B E Ab: NONREACTIVE

## 2019-12-05 LAB — HEPATITIS B E ANTIGEN: Hep B E Ag: REACTIVE — AB

## 2019-12-28 ENCOUNTER — Other Ambulatory Visit: Payer: Self-pay | Admitting: Internal Medicine

## 2019-12-28 DIAGNOSIS — B181 Chronic viral hepatitis B without delta-agent: Secondary | ICD-10-CM

## 2020-01-01 MED FILL — VEMLIDY 25 MG TABLET: 25 | 30 days supply | Qty: 30 | Fill #0

## 2020-01-27 MED FILL — VEMLIDY 25 MG TABLET: 25 | 30 days supply | Qty: 30 | Fill #1

## 2020-02-24 ENCOUNTER — Telehealth: Payer: Self-pay

## 2020-02-24 NOTE — Telephone Encounter (Signed)
[facility] Patient Advocate Encounter   Received notification from [facility] that prior authorization for VEMLIDY is required.   Jarae submitted on 02/24/20 Key [id number] Status is pending    [facility] will continue to follow.   Ileene Patrick, [facility] Patient [facility] for Infectious Disease Phone: [phone] Fax:  [phone]

## 2020-03-01 NOTE — Telephone Encounter (Signed)
Prior Authorization is requiring additional lab work as outlined below in order to authorize vemlidy therapy.  [employer] will have to request a Rade interpreter in order to make a call to the patient. They do not have Rade interpreter available on demand.   HIV test, INR, Bilirubin, Albumin.

## 2020-03-07 ENCOUNTER — Telehealth: Payer: Self-pay

## 2020-03-07 DIAGNOSIS — B181 Chronic viral hepatitis B without delta-agent: Secondary | ICD-10-CM

## 2020-03-07 MED ORDER — VEMLIDY 25 MG PO TABS: 1.0000 | ORAL_TABLET | Freq: Every day | ORAL | 5 refills | Status: DC

## 2020-03-07 NOTE — Addendum Note (Signed)
Addended by: Eugenia Mcalpine on: 03/07/2020 01:52 PM   Modules accepted: Orders

## 2020-03-07 NOTE — Telephone Encounter (Signed)
[place] with script and prior approval authorization.  Medication phoned to pharmacy.  Vemlidy can only be filled at this pharmacy.   Pharmacy added to patients list.   Directions were to be printed in [place].     Patient has been informed medication was approved and sent to pharmacy.    Laverle Patter , RN

## 2020-03-07 NOTE — Addendum Note (Signed)
Addended by: Laverle Patter on: 03/07/2020 11:12 AM   Modules accepted: Orders

## 2020-03-07 NOTE — Telephone Encounter (Signed)
[facility] Patient Advocate Encounter  Prior Authorization for Shirlee Latch has been approved.    Breanne# [id number] Effective dates: 03/04/20 through 03/03/21  Patient have to get her script filled through [place] @844 [phone] Fax [phone].  [facility] will continue to follow.  Ileene Patrick, [facility] Patient [facility] for Infectious Disease Phone: [phone] Fax:  [phone]

## 2020-05-05 ENCOUNTER — Other Ambulatory Visit (HOSPITAL_COMMUNITY): Payer: Self-pay

## 2020-11-29 ENCOUNTER — Ambulatory Visit (INDEPENDENT_AMBULATORY_CARE_PROVIDER_SITE_OTHER): Payer: 59 | Admitting: Internal Medicine

## 2020-11-29 ENCOUNTER — Encounter: Payer: Self-pay | Admitting: Internal Medicine

## 2020-11-29 ENCOUNTER — Other Ambulatory Visit: Payer: Self-pay

## 2020-11-29 DIAGNOSIS — B181 Chronic viral hepatitis B without delta-agent: Secondary | ICD-10-CM | POA: Diagnosis not present

## 2020-11-29 NOTE — Progress Notes (Signed)
         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) 11/05/2012    Priority: 1.   COVID-19 12/02/2019   OVERWEIGHT 02/07/2009    Patient's Medications  New Prescriptions   No medications on file  Previous Medications   FLUTICASONE (FLONASE) 50 MCG/ACT NASAL SPRAY    Place 1 spray into both nostrils daily.   HYDROCORTISONE 2.5 % CREAM    Apply topically 2 (two) times daily.   NORGESTIMATE-ETHINYL ESTRADIOL TRIPHASIC (ORTHO TRI-CYCLEN, 28,) 0.18/0.215/0.25 MG-35 MCG TABLET    Take 1 tablet by mouth daily.   TENOFOVIR ALAFENAMIDE FUMARATE (VEMLIDY) 25 MG TABS    Take 1 tablet (25 mg total) by mouth daily. with food  Modified Medications   No medications on file  Discontinued Medications   No medications on file    Subjective: Brenda Brady is in for her routine follow-up visit.  She has had no problems obtaining, taking or tolerating her Vemlidy. She recently missed 1-2 doses when her refills ran out.   Review of Systems: Review of Systems  Constitutional:  Negative for chills, diaphoresis, fever, malaise/fatigue and weight loss.  Gastrointestinal:  Negative for abdominal pain, constipation, diarrhea, nausea and vomiting.   Past Medical History:  Diagnosis Date   BMI 29.0-29.9,adult    Hepatitis B infection    Hepatitis B, chronic (HCC)    Hepatitis B, chronic (HCC)     Social History   Tobacco Use   Smoking status: Never   Smokeless tobacco: Never  Substance Use Topics   Alcohol use: No   Drug use: No    No family history on file.  No Known Allergies  Objective: Vitals:   11/29/20 1550  BP: 136/90  Pulse: 77  Temp: 99.1 F (37.3 C)  TempSrc: Oral  SpO2: 99%  Weight: 138 lb (62.6 kg)   Body mass index is 28.58 kg/m.  Physical Exam Constitutional:      Comments: She is in good spirits.    Abdominal:     General: There is no distension.     Palpations: Abdomen is soft.  There is no mass.     Tenderness: There is no abdominal tenderness.  Skin:    Findings: No rash.  Neurological:     Mental Status: She is alert and oriented to person, place, and time.    Lab Results 12/02/2019 Liver enzymes normal Hepatitis B e antigen positive Hepatitis B e antibody negative Hepatitis B viral load less than 10  Problem List Items Addressed This Visit       1.   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  She has had excellent viral suppression since starting therapy shortly after diagnosis.  She will get repeat blood work today and continue [name].      Relevant Orders   CBC   Comprehensive metabolic panel   Hepatitis B DNA, ultraquantitative, PCR   Hepatitis B e antibody   Hepatitis B e antigen     Michel Bickers, MD [facility] for [place] [phone] pager   [phone] cell 11/29/2020, 4:16 PM

## 2020-11-29 NOTE — Assessment & Plan Note (Signed)
She has chronic, e antigen positive hepatitis B that was diagnosed in 2014.  She has had excellent viral suppression since starting therapy shortly after diagnosis.  She will get repeat blood work today and continue [name].

## 2020-12-01 LAB — HEPATITIS B DNA, ULTRAQUANTITATIVE, PCR
Hepatitis B DNA (Calc): <1 Log IU/mL
Hepatitis B DNA: <10 IU/mL

## 2020-12-01 LAB — HEPATITIS B E ANTIGEN: Hep B E Ag: REACTIVE — AB

## 2020-12-01 LAB — CBC
HCT: 37.7 % (ref 35.0–45.0)
Hemoglobin: 11.5 g/dL — ABNORMAL LOW (ref 11.7–15.5)
MCH: 22.5 pg — ABNORMAL LOW (ref 27.0–33.0)
MCHC: 30.5 g/dL — ABNORMAL LOW (ref 32.0–36.0)
MCV: 73.9 fL — ABNORMAL LOW (ref 80.0–100.0)
MPV: 9.9 fL (ref 7.5–12.5)
Platelets: 326 10*3/uL (ref 140–400)
RBC: 5.1 10*6/uL (ref 3.80–5.10)
RDW: 15.2 % — ABNORMAL HIGH (ref 11.0–15.0)
WBC: 10 10*3/uL (ref 3.8–10.8)

## 2020-12-01 LAB — COMPREHENSIVE METABOLIC PANEL
AG Ratio: 1 (calc) (ref 1.0–2.5)
ALT: 10 U/L (ref 6–29)
AST: 14 U/L (ref 10–35)
Albumin: 3.9 g/dL (ref 3.6–5.1)
Alkaline phosphatase (APISO): 55 U/L (ref 31–125)
BUN: 13 mg/dL (ref 7–25)
CO2: 27 mmol/L (ref 20–32)
Calcium: 9.2 mg/dL (ref 8.6–10.2)
Chloride: 103 mmol/L (ref 98–110)
Creat: 0.73 mg/dL (ref 0.50–0.99)
Globulin: 3.9 g/dL (calc) — ABNORMAL HIGH (ref 1.9–3.7)
Glucose, Bld: 91 mg/dL (ref 65–99)
Potassium: 3.9 mmol/L (ref 3.5–5.3)
Sodium: 138 mmol/L (ref 135–146)
Total Bilirubin: 0.4 mg/dL (ref 0.2–1.2)
Total Protein: 7.8 g/dL (ref 6.1–8.1)

## 2020-12-01 LAB — HEPATITIS B E ANTIBODY: Hep B E Ab: NONREACTIVE

## 2020-12-20 NOTE — Progress Notes (Signed)
    SUBJECTIVE:   CHIEF COMPLAINT / HPI: PAP and physical  No acute concerns Would like testing for Diabetes, Low iron and has been having congestion with itchy nose, ears and eyes since September. No family history of DM, asymptomatic.  Has had one random random elevated glucose in past. Reports history of low iron.  Has fatigue at times.  Denies any shortness of breath or chest pain.  No bleeding or heavy menses.  Allergic rhinitis Has used Zyrtec in past.  Itchy eyes, nose and ears along with nasal congestion.  Seems to worsen with seasons. Denies any fevers, cough or sputum production.  PERTINENT  PMH / PSH:  Active Hepatitis B, followed by [facility] Microcytic anemia  OBJECTIVE:   BP (!) 144/90   Pulse 88   Wt 139 lb 9.6 oz (63.3 kg)   SpO2 99%   BMI 28.91 kg/m    General: Alert, no acute distress Cardio: Normal S1 and S2, RRR, no r/m/g Pulm: CTAB, normal work of breathing Abdomen: Bowel sounds normal. Abdomen soft and non-tender.  Extremities: No peripheral edema.    ASSESSMENT/PLAN:   Encounter for annual physical exam PAP UTD.  Due in 2026 Colonoscopy and Mammogram ordered today Flu and Prevnar vaccines today Declined COVID vaccine Lipids, A1c today Follow up with PCP as needed  Chronic active viral hepatitis B Followed by ID and tolerating medication well.  Microcytic anemia Longstanding MCV 70's Mentzer score >13 indicating likely iron deficiency Ferritin today Follow up with PCP   Elevated BP without diagnosis of hypertension Elevated today.  Not on medications.  Has had increase in weight. Recent BMet wnl Monitor BP at home Follow up with PCP to discuss weight and BP management    Allergic rhinitis Similar symptoms in past and had used Flonase and Zyrtec periodically Continue Zyrtec 5 mg daily OTC Netty pot as needed, humidifier at night. Flonase as needed, patient reports has some at home.     Carollee Leitz, MD [facility]

## 2020-12-20 NOTE — Patient Instructions (Addendum)
Thank you for coming to see me today. It was a pleasure.   We will get some labs today.  If they are abnormal or we need to do something about them, I will call you.  If they are normal, I will send you a message on MyChart (if it is active) or a letter in the mail.  If you don't hear from [name] in 2 weeks, please call the office at the number below.   Colonoscopy ordered and they will call you with an appointment  Please call to schedule a Mammogram   Please follow-up with PCP in 1 year  If you have any questions or concerns, please do not hesitate to call the office at [phone].  Best,   Carollee Leitz, MD

## 2020-12-21 ENCOUNTER — Other Ambulatory Visit: Payer: Self-pay

## 2020-12-21 ENCOUNTER — Ambulatory Visit (INDEPENDENT_AMBULATORY_CARE_PROVIDER_SITE_OTHER): Payer: 59 | Admitting: Family Medicine

## 2020-12-21 VITALS — BP 144/90 | HR 88 | Wt 139.6 lb

## 2020-12-21 DIAGNOSIS — Z1211 Encounter for screening for malignant neoplasm of colon: Secondary | ICD-10-CM | POA: Diagnosis not present

## 2020-12-21 DIAGNOSIS — R030 Elevated blood-pressure reading, without diagnosis of hypertension: Secondary | ICD-10-CM

## 2020-12-21 DIAGNOSIS — Z23 Encounter for immunization: Secondary | ICD-10-CM

## 2020-12-21 DIAGNOSIS — Z0000 Encounter for general adult medical examination without abnormal findings: Secondary | ICD-10-CM

## 2020-12-21 DIAGNOSIS — Z1231 Encounter for screening mammogram for malignant neoplasm of breast: Secondary | ICD-10-CM

## 2020-12-21 DIAGNOSIS — B181 Chronic viral hepatitis B without delta-agent: Secondary | ICD-10-CM | POA: Diagnosis not present

## 2020-12-21 DIAGNOSIS — J302 Other seasonal allergic rhinitis: Secondary | ICD-10-CM

## 2020-12-21 DIAGNOSIS — D509 Iron deficiency anemia, unspecified: Secondary | ICD-10-CM

## 2020-12-21 LAB — POCT GLYCOSYLATED HEMOGLOBIN (HGB A1C): Hemoglobin A1C: 5 % (ref 4.0–5.6)

## 2020-12-22 LAB — LIPID PANEL
Chol/HDL Ratio: 3.6 ratio (ref 0.0–4.4)
Cholesterol, Total: 186 mg/dL (ref 100–199)
HDL: 52 mg/dL (ref >39)
LDL Chol Calc (NIH): 112 mg/dL — ABNORMAL HIGH (ref 0–99)
Triglycerides: 123 mg/dL (ref 0–149)
VLDL Cholesterol Cal: 22 mg/dL (ref 5–40)

## 2020-12-22 LAB — FERRITIN: Ferritin: 203 ng/mL — ABNORMAL HIGH (ref 15–150)

## 2020-12-24 ENCOUNTER — Encounter: Payer: Self-pay | Admitting: Family Medicine

## 2020-12-24 DIAGNOSIS — D509 Iron deficiency anemia, unspecified: Secondary | ICD-10-CM | POA: Insufficient documentation

## 2020-12-24 DIAGNOSIS — J309 Allergic rhinitis, unspecified: Secondary | ICD-10-CM | POA: Insufficient documentation

## 2020-12-24 DIAGNOSIS — R030 Elevated blood-pressure reading, without diagnosis of hypertension: Secondary | ICD-10-CM | POA: Insufficient documentation

## 2020-12-24 DIAGNOSIS — Z0000 Encounter for general adult medical examination without abnormal findings: Secondary | ICD-10-CM | POA: Insufficient documentation

## 2020-12-24 DIAGNOSIS — I10 Essential (primary) hypertension: Secondary | ICD-10-CM | POA: Insufficient documentation

## 2020-12-24 NOTE — Assessment & Plan Note (Signed)
Longstanding MCV 70's Mentzer score >13 indicating likely iron deficiency Ferritin today Follow up with PCP

## 2020-12-24 NOTE — Assessment & Plan Note (Signed)
Elevated today.  Not on medications.  Has had increase in weight. Recent BMet wnl Monitor BP at home Follow up with PCP to discuss weight and BP management

## 2020-12-24 NOTE — Assessment & Plan Note (Signed)
Similar symptoms in past and had used Flonase and Zyrtec periodically Continue Zyrtec 5 mg daily OTC Netty pot as needed, humidifier at night. Flonase as needed, patient reports has some at home.

## 2020-12-24 NOTE — Assessment & Plan Note (Signed)
Followed by ID and tolerating medication well.

## 2020-12-24 NOTE — Assessment & Plan Note (Addendum)
PAP UTD.  Due in 2026 Colonoscopy and Mammogram ordered today Flu and Prevnar vaccines today Declined COVID vaccine Lipids, A1c today Follow up with PCP as needed

## 2021-01-05 NOTE — Addendum Note (Signed)
Addended by: Lanice Shirts on: 01/05/2021 04:43 PM   Modules accepted: Orders

## 2021-01-29 ENCOUNTER — Other Ambulatory Visit: Payer: Self-pay | Admitting: Internal Medicine

## 2021-01-29 DIAGNOSIS — B181 Chronic viral hepatitis B without delta-agent: Secondary | ICD-10-CM

## 2021-01-31 ENCOUNTER — Ambulatory Visit
Admission: RE | Admit: 2021-01-31 | Discharge: 2021-01-31 | Disposition: A | Discharge status: Home / Self Care | Payer: 59 | Source: Ambulatory Visit | Attending: Family Medicine | Admitting: Family Medicine

## 2021-01-31 DIAGNOSIS — Z1231 Encounter for screening mammogram for malignant neoplasm of breast: Secondary | ICD-10-CM

## 2021-03-09 ENCOUNTER — Encounter: Payer: Self-pay | Admitting: Internal Medicine

## 2021-03-10 ENCOUNTER — Other Ambulatory Visit: Payer: Self-pay

## 2021-03-10 ENCOUNTER — Telehealth: Payer: Self-pay

## 2021-03-10 ENCOUNTER — Encounter: Payer: Self-pay | Admitting: Pharmacist

## 2021-03-10 ENCOUNTER — Other Ambulatory Visit (HOSPITAL_COMMUNITY): Payer: Self-pay

## 2021-03-10 DIAGNOSIS — B181 Chronic viral hepatitis B without delta-agent: Secondary | ICD-10-CM

## 2021-03-10 MED ORDER — VEMLIDY 25 MG PO TABS
1.0000 | ORAL_TABLET | Freq: Every day | ORAL | 5 refills | Status: DC
  Filled 2021-03-10 (×3): qty 30, 30d supply, fill #0
  Filled 2021-04-07: qty 30, 30d supply, fill #1
  Filled 2021-04-27: qty 30, 30d supply, fill #2
  Filled 2021-05-29: qty 30, 30d supply, fill #3
  Filled 2021-07-10: qty 30, 30d supply, fill #4
  Filled 2021-08-09: qty 30, 30d supply, fill #5

## 2021-03-10 NOTE — Telephone Encounter (Signed)
[facility] Patient Advocate Encounter   Was successful in obtaining a [name] copay card for [name].  This copay card will make the patients copay 0.00.  I have spoken with the patient.    The billing information is as follows and has been shared with [name].      Brenda Brady, [facility] Patient [facility] for Infectious Disease Phone: [phone] Fax:  [phone]

## 2021-03-10 NOTE — Telephone Encounter (Signed)
Mailing med today for pt

## 2021-03-31 ENCOUNTER — Other Ambulatory Visit (HOSPITAL_COMMUNITY): Payer: Self-pay

## 2021-04-03 ENCOUNTER — Other Ambulatory Visit (HOSPITAL_COMMUNITY): Payer: Self-pay

## 2021-04-07 ENCOUNTER — Other Ambulatory Visit (HOSPITAL_COMMUNITY): Payer: Self-pay

## 2021-04-27 ENCOUNTER — Other Ambulatory Visit (HOSPITAL_COMMUNITY): Payer: Self-pay

## 2021-05-02 ENCOUNTER — Other Ambulatory Visit (HOSPITAL_COMMUNITY): Payer: Self-pay

## 2021-05-12 ENCOUNTER — Ambulatory Visit (INDEPENDENT_AMBULATORY_CARE_PROVIDER_SITE_OTHER): Payer: 59

## 2021-05-12 ENCOUNTER — Encounter (HOSPITAL_COMMUNITY): Payer: Self-pay

## 2021-05-12 ENCOUNTER — Ambulatory Visit (HOSPITAL_COMMUNITY)
Admission: EM | Admit: 2021-05-12 | Discharge: 2021-05-12 | Disposition: A | Discharge status: Home / Self Care | Payer: 59 | Attending: Nurse Practitioner | Admitting: Nurse Practitioner

## 2021-05-12 ENCOUNTER — Other Ambulatory Visit (HOSPITAL_COMMUNITY): Payer: Self-pay

## 2021-05-12 DIAGNOSIS — M533 Sacrococcygeal disorders, not elsewhere classified: Secondary | ICD-10-CM

## 2021-05-12 DIAGNOSIS — M899 Disorder of bone, unspecified: Secondary | ICD-10-CM | POA: Diagnosis not present

## 2021-05-12 DIAGNOSIS — S3210XA Unspecified fracture of sacrum, initial encounter for closed fracture: Secondary | ICD-10-CM

## 2021-05-12 MED ORDER — TRAMADOL HCL 50 MG PO TABS
50.0000 mg | ORAL_TABLET | Freq: Two times a day (BID) | ORAL | 0 refills | Status: AC | PRN
  Filled 2021-05-12: qty 10, 5d supply, fill #0

## 2021-05-12 NOTE — Discharge Instructions (Addendum)
-   X-ray today shows a fracture of your sacral bone.  It also shows a possible bone lesion.   ?- Please stop using ibuprofen as NSAIDs can affect bone healing.  You can continue Tylenol 500 mg every 6 hours as needed for pain.  You can also use tramadol 50 mg every 12 hours as needed for pain.  Do not take tramadol while driving or operating heavy machinery. ?-Please follow-up with the primary care provider regarding the bone lesion ?-You can also use a donut cushion or wedge pillow support to help with pain when sitting ?-Follow-up with sports medicine if your pain does not improve as expected ?

## 2021-05-12 NOTE — ED Triage Notes (Signed)
Pt reports falling on Wednesday and hurting her back. States it hurts to sit down. ?

## 2021-05-12 NOTE — ED Provider Notes (Signed)
?[facility] ? ? ? ?CSN: [id number] ?Arrival date & time: 05/12/21  1448 ? ? ?  ? ?History   ?Chief Complaint ?Chief Complaint  ?Patient presents with  ? Back Pain  ? ? ?HPI ?Brenda Brady is a 46 y.o. female.  ? ?Patient present with daughter and reports pain to tailbone area since falling on her bottom at the gym 2 days ago.  She was doing box jumps and fell backwards directly onto her bottom.  She reports the pain has been ongoing since.  She denies any bowel or bladder incontinence, saddle anesthesia, numbness or tingling rating down her legs, fevers, and nausea/vomiting.  She has tried heat, Tylenol, and ibuprofen without relief of symptoms.  Her last menstrual period was last month and she has an IUD in place. ? ? ?Past Medical History:  ?Diagnosis Date  ? BMI 29.0-29.9,adult   ? Hepatitis B infection   ? Hepatitis B, chronic ([place])   ? Hepatitis B, chronic ([place])   ? ? ?Patient Active Problem List  ? Diagnosis Date Noted  ? Encounter for annual physical exam 12/24/2020  ? Microcytic anemia 12/24/2020  ? Elevated BP without diagnosis of hypertension 12/24/2020  ? Allergic rhinitis 12/24/2020  ? COVID-19 12/02/2019  ? Chronic active viral hepatitis B ([place]) 11/05/2012  ? OVERWEIGHT 02/07/2009  ? ? ?Past Surgical History:  ?Procedure Laterality Date  ? VAGINAL DELIVERY    ? ? ?OB History   ?No obstetric history on file. ?  ? ? ? ?Home Medications   ? ?Prior to Admission medications   ?Medication Sig Start Date End Date Taking? Authorizing Provider  ?traMADol (ULTRAM) 50 MG tablet Take 1 tablet (50 mg total) by mouth every 12 (twelve) hours as needed for up to 5 days. 05/12/21 05/17/21 Yes Eulogio Bear, NP  ?Tenofovir Alafenamide Fumarate (VEMLIDY) 25 MG TABS Take 1 tablet (25 mg total) by mouth daily. with food 03/10/21   Michel Bickers, MD  ? ? ?Family History ?History reviewed. No pertinent family history. ? ?Social History ?Social History  ? ?Tobacco Use  ? Smoking status: Never  ? Smokeless tobacco: Never   ?Substance Use Topics  ? Alcohol use: No  ? Drug use: No  ? ? ? ?Allergies   ?Patient has no known allergies. ? ? ?Review of Systems ?Review of Systems ?Per HPI ? ?Physical Exam ?Triage Vital Signs ?ED Triage Vitals  ?Enc Vitals Group  ?   BP 05/12/21 1011 (!) 143/91  ?   Pulse Rate 05/12/21 1011 75  ?   Resp 05/12/21 1011 17  ?   Temp 05/12/21 1016 98.7 ?F (37.1 ?C)  ?   Temp Source 05/12/21 1016 Oral  ?   SpO2 05/12/21 1011 97 %  ?   Weight --   ?   Height --   ?   Head Circumference --   ?   Peak Flow --   ?   Pain Score 05/12/21 1010 6  ?   Pain Loc --   ?   Pain Edu? --   ?   Excl. in [place]? --   ? ?No data found. ? ?Updated Vital Signs ?BP (!) 154/96 (BP Location: Right Arm)   Pulse 78   Temp 98.7 ?F (37.1 ?C) (Oral)   Resp 16   SpO2 100%  ? ?Visual Acuity ?Right Eye Distance:   ?Left Eye Distance:   ?Bilateral Distance:   ? ?Right Eye Near:   ?Left Eye Near:    ?  Bilateral Near:    ? ?Physical Exam ?Vitals and nursing note reviewed.  ?Constitutional:   ?   General: She is not in acute distress. ?   Appearance: Normal appearance. She is not toxic-appearing.  ?   Comments: Bent over exam table; uncomfortable appearing  ?Pulmonary:  ?   Effort: Pulmonary effort is normal. No respiratory distress.  ?Musculoskeletal:  ?     Legs: ? ?   Comments: TTP in the area marked; no obvious swelling, erythema, deformity.   ?Skin: ?   General: Skin is warm and dry.  ?   Coloration: Skin is not jaundiced or pale.  ?   Findings: No erythema.  ?Neurological:  ?   Mental Status: She is alert and oriented to person, place, and time.  ?   Motor: No weakness.  ?   Gait: Gait normal.  ?Psychiatric:     ?   Mood and Affect: Mood normal.     ?   Behavior: Behavior normal.  ? ? ? ?UC Treatments / Results  ?Labs ?(all labs ordered are listed, but only abnormal results are displayed) ?Labs Reviewed - No data to display ? ?EKG ? ? ?Radiology ?DG Sacrum/Coccyx ? ?Result Date: 05/12/2021 ?CLINICAL DATA:  Fall 2 days ago.  Sacrococcygeal pain.  EXAM: SACRUM AND COCCYX - 2+ VIEW COMPARISON:  None. FINDINGS: A probable mildly displaced fracture is seen involving the inferior sacrum on the lateral projection. In addition, there is a lucent area in the lower right sacrum with lack of visualization of the cortex, which is suspicious for a lytic bone lesion. IMPRESSION: Probable mildly displaced fracture of the inferior sacrum. Possible lytic bone lesion in the lower right sacrum. Pelvis CT without contrast is recommended for further evaluation. Electronically Signed   By: Marlaine Hind M.D.   On: 05/12/2021 11:09   ? ?Procedures ?Procedures (including critical care time) ? ?Medications Ordered in UC ?Medications - No data to display ? ?Initial Impression / Assessment and Plan / UC Course  ?I have reviewed the triage vital signs and the nursing notes. ? ?Pertinent labs & imaging results that were available during my care of the patient were reviewed by me and considered in my medical decision making (see chart for details). ? ?  ?Sacrum x-ray shows probably mildly displaced fracture of inferior sacrum.  Also shows a possible bone lesion in the right lower sacrum.  Discussed findings with daughter and patient.  Discussed fracture of bone.  Stop NSAIDs and start Tylenol and Tramadol alternating for pain.  Can continue ice/heat to help with pain.  Use donut or wedge cushion to help with pain when sitting.  Note given for work.  If pain persists without improvement, follow up with Sports Medicine. ? ?Regarding possible bone lesion, I encouraged she make an appointment with her primary care provider to evaluate this further.  The patient was given the opportunity to ask questions.  All questions answered to their satisfaction.  The patient is in agreement to this plan.  ? ?Final Clinical Impressions(s) / UC Diagnoses  ? ?Final diagnoses:  ?Pain in sacrum  ?Closed fracture of sacrum, unspecified portion of sacrum, initial encounter ([place])  ?Bone lesion  ? ? ? ?Discharge  Instructions   ? ?  ?- X-ray today shows a fracture of your sacral bone.  It also shows a possible bone lesion.   ?- Please stop using ibuprofen as NSAIDs can affect bone healing.  You can continue Tylenol 500 mg every  6 hours as needed for pain.  You can also use tramadol 50 mg every 12 hours as needed for pain.  Do not take tramadol while driving or operating heavy machinery. ?-Please follow-up with the primary care provider regarding the bone lesion ?-You can also use a donut cushion or wedge pillow support to help with pain when sitting ?-Follow-up with sports medicine if your pain does not improve as expected ? ? ? ? ?ED Prescriptions   ? ? Medication Sig Dispense Auth. Provider  ? traMADol (ULTRAM) 50 MG tablet Take 1 tablet (50 mg total) by mouth every 12 (twelve) hours as needed for up to 5 days. 10 tablet Eulogio Bear, NP  ? ?  ? ?I have reviewed the PDMP during this encounter. ?  ?Eulogio Bear, NP ?05/12/21 1139 ? ?

## 2021-05-23 ENCOUNTER — Other Ambulatory Visit (HOSPITAL_COMMUNITY): Payer: Self-pay

## 2021-05-29 ENCOUNTER — Other Ambulatory Visit (HOSPITAL_COMMUNITY): Payer: Self-pay

## 2021-05-30 ENCOUNTER — Encounter: Payer: Self-pay | Admitting: Family Medicine

## 2021-05-30 ENCOUNTER — Ambulatory Visit (INDEPENDENT_AMBULATORY_CARE_PROVIDER_SITE_OTHER): Payer: 59 | Admitting: Family Medicine

## 2021-05-30 VITALS — BP 130/83 | HR 75 | Ht <= 58 in | Wt 138.4 lb

## 2021-05-30 DIAGNOSIS — M899 Disorder of bone, unspecified: Secondary | ICD-10-CM | POA: Diagnosis not present

## 2021-05-30 DIAGNOSIS — S322XXD Fracture of coccyx, subsequent encounter for fracture with routine healing: Secondary | ICD-10-CM | POA: Diagnosis not present

## 2021-05-30 DIAGNOSIS — M533 Sacrococcygeal disorders, not elsewhere classified: Secondary | ICD-10-CM | POA: Diagnosis not present

## 2021-05-30 MED ORDER — TRAMADOL HCL 50 MG PO TABS
50.0000 mg | ORAL_TABLET | Freq: Four times a day (QID) | ORAL | 0 refills | Status: AC | PRN
Start: 2021-05-30 — End: 2021-06-04

## 2021-05-30 NOTE — Patient Instructions (Addendum)
Call the [facility] for an appointment ?[address] ? [phone] ? ?Go to [facility] for your CT to evaluate the bony lesion seen on your xray.  ? ?Avoid Ibuprofen/Aleve/Motrin/Naprosyn for pain  ? ?

## 2021-05-30 NOTE — Progress Notes (Signed)
? ?  SUBJECTIVE:  ? ?CHIEF COMPLAINT / HPI:  ? ?Chief Complaint  ?Patient presents with  ? broken tailbone  ? ? ? ?Brenda Brady is a 46 y.o. female here for follow-up after tailbone fracture.  Patient reports she was outside walking and fell onto her butt about 3 weeks ago.  Pain did not subside so she went to the urgent care and was found to have a fracture of her tailbone.  She has been sitting on a donut.  Notes that tramadol and Tylenol are providing some relief.  Notes that she cannot sit for long periods of time without increased pain. ? ? ?PERTINENT  PMH / PSH: reviewed and updated as appropriate  ? ?OBJECTIVE:  ? ?BP 130/83   Pulse 75   Ht '4\' 10"'$  (1.473 m)   Wt 138 lb 6.4 oz (62.8 kg)   SpO2 100%   BMI 28.93 kg/m?   ? ?GEN: pleasant uncomfortably appearing female ?CV: regular rate  ?RESP: no increased work of breathing ?MSK: shifting weight, distal sacral tenderness, minimal gluteal tenderness bilaterally, antalgic gait ?SKIN: warm, dry ?NEURO: alert, moves all extremities appropriately, no lower extremity weakness ? ? ? ?ASSESSMENT/PLAN:  ? ?Coccydynia ?Patient with acute coccydynia related to fall and subsequent coccyx fracture.  Urgent care visit on 05/12/2021 showed probable mildly displaced fracture of the inferior sacrum.  She continues to have ongoing pain which is likely to persist for weeks.  Continue Tylenol as needed.  Refill tramadol for severe pain.  Referral placed to sports medicine. ? ?Lytic lesion of bone on x-ray ?Possible lytic bone lesion in the right lower sacrum seen on x-ray on 05/12/2021 incidentally.  Discussed need for CT to further evaluate if patient is agreeable.  Ordered CT pelvis without contrast.  This is scheduled for early May. ?  ? ?Lyndee Hensen, DO ?PGY-3, [facility] ?06/02/2021  ? ? ? ? ? ? ? ? ?

## 2021-06-01 ENCOUNTER — Other Ambulatory Visit (HOSPITAL_COMMUNITY): Payer: Self-pay

## 2021-06-01 ENCOUNTER — Ambulatory Visit: Payer: 59 | Admitting: Sports Medicine

## 2021-06-01 VITALS — BP 130/68 | Ht <= 58 in | Wt 138.0 lb

## 2021-06-01 DIAGNOSIS — S3210XA Unspecified fracture of sacrum, initial encounter for closed fracture: Secondary | ICD-10-CM | POA: Diagnosis not present

## 2021-06-01 NOTE — Patient Instructions (Signed)
You have a small fracture at the bottom of your sacrum.  This will heal over time.  Continue to take your Tylenol only if needed for pain.  Make sure that you keep your CT appointment for May 2.  I will see you back in my office in 3 weeks.  I am going to keep you out of work until then. ?

## 2021-06-01 NOTE — Progress Notes (Signed)
? ?  Subjective:  ? ? Patient ID: Brenda Brady, female    DOB: August 29, 1975, 46 y.o.   MRN: [id number] ? ?HPI chief complaint: Low back fracture ? ?Very pleasant 46 year old female comes in today for treatment of a mildly displaced fracture to the inferior sacrum.  Injury occurred on April 5 when she was doing box jumps at a local gym and she fell backward directly onto her bottom.  Immediate pain.  She was then seen in the emergency room on April 7.  X-rays at that time showed a probable mildly displaced fracture of the inferior sacrum.  No other fractures were seen.  Also seen on that study was a possible lytic bone lesion in the right lower sacrum for which a pelvic CT has been ordered and is currently scheduled for May 2.  Patient localizes all of her pain to the lower sacral area.  She has a donut cushion which has been helpful but she is beginning to get some intermittent numbness in her legs likely from the donut cushion.  She has minimal pain with walking.  She denies weakness in the legs.  No groin pain.  She was initially treated with Tylenol and tramadol but she did not tolerate the tramadol very well.  History is obtained today with the help of an interpreter ? ?Past medical history reviewed ?Medications reviewed ?Allergies reviewed ? ? ? ?Review of Systems ?As above ?   ?Objective:  ? Physical Exam ? ?Well-developed, well-nourished.  No acute distress ? ?Lumbar spine: Patient has tenderness to palpation over the lower sacral segments.  No tenderness proximal to this.  No spasm.  Neurological exam shows no focal neurological deficit of either lower extremity. ? ?X-rays are as above ? ? ?   ?Assessment & Plan:  ? ?3 weeks status post mildly displaced inferior sacral fracture ?Possible lytic bone lesion in the right lower sacrum ? ?Patient is reassured that her fracture is stable and will heal over time.  She will continue to use her donut cushion for comfort and she may use her Tylenol only when needed.  She  understands the importance of keeping her CT appointment on May 2.  This will further evaluate the lytic lesion seen in her sacrum on her recent x-ray and I will also allow me to evaluate degree of healing seen around her fracture.  She will follow-up with me again in 3 weeks.  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  Call with questions or concerns in the interim. ? ?This note was dictated using Dragon naturally speaking software and may contain errors in syntax, spelling, or content which have not been identified prior to signing this note.  ?

## 2021-06-02 DIAGNOSIS — M533 Sacrococcygeal disorders, not elsewhere classified: Secondary | ICD-10-CM | POA: Insufficient documentation

## 2021-06-02 DIAGNOSIS — M899 Disorder of bone, unspecified: Secondary | ICD-10-CM | POA: Insufficient documentation

## 2021-06-02 NOTE — Assessment & Plan Note (Signed)
Patient with acute coccydynia related to fall and subsequent coccyx fracture.  Urgent care visit on 05/12/2021 showed probable mildly displaced fracture of the inferior sacrum.  She continues to have ongoing pain which is likely to persist for weeks.  Continue Tylenol as needed.  Refill tramadol for severe pain.  Referral placed to sports medicine. ?

## 2021-06-02 NOTE — Assessment & Plan Note (Signed)
Possible lytic bone lesion in the right lower sacrum seen on x-ray on 05/12/2021 incidentally.  Discussed need for CT to further evaluate if patient is agreeable.  Ordered CT pelvis without contrast.  This is scheduled for early May. ?

## 2021-06-05 ENCOUNTER — Telehealth: Payer: Self-pay

## 2021-06-05 NOTE — Telephone Encounter (Signed)
Received call from radiology in regards to patients CT apt scheduled for tomorrow.  ? ?Insurance has not been authorized and they ask I call the patient to cancel.  ? ?I called patient with am interpreter, however no answer or option for VM.  ?

## 2021-06-06 ENCOUNTER — Encounter: Payer: Self-pay | Admitting: Family Medicine

## 2021-06-06 ENCOUNTER — Ambulatory Visit (HOSPITAL_COMMUNITY): Payer: 59

## 2021-06-07 ENCOUNTER — Telehealth: Payer: Self-pay

## 2021-06-07 NOTE — Telephone Encounter (Signed)
Attempted to reach patient through interpreter # [phone]. No answer. LVM of CT scan of pelvis on May 9th at 9:00 at [facility]. Patient is not to have anything to eat or drink 4 hours prior to appointment time. Salvatore Marvel, CMA ? ?

## 2021-06-08 NOTE — Telephone Encounter (Signed)
2nd attempt to reach patient through interpreter ID# [phone]. No answer. LVM of note. Salvatore Marvel, CMA ? ?

## 2021-06-13 ENCOUNTER — Ambulatory Visit (HOSPITAL_COMMUNITY)
Admission: RE | Admit: 2021-06-13 | Discharge: 2021-06-13 | Disposition: A | Discharge status: Home / Self Care | Payer: 59 | Source: Ambulatory Visit | Attending: Family Medicine | Admitting: Family Medicine

## 2021-06-13 DIAGNOSIS — S322XXD Fracture of coccyx, subsequent encounter for fracture with routine healing: Secondary | ICD-10-CM | POA: Insufficient documentation

## 2021-06-13 DIAGNOSIS — M899 Disorder of bone, unspecified: Secondary | ICD-10-CM | POA: Diagnosis present

## 2021-06-21 ENCOUNTER — Other Ambulatory Visit (HOSPITAL_COMMUNITY): Payer: Self-pay

## 2021-06-22 ENCOUNTER — Ambulatory Visit: Payer: 59 | Admitting: Sports Medicine

## 2021-06-22 VITALS — BP 144/93 | Ht <= 58 in | Wt 138.0 lb

## 2021-06-22 DIAGNOSIS — S3210XD Unspecified fracture of sacrum, subsequent encounter for fracture with routine healing: Secondary | ICD-10-CM

## 2021-06-22 NOTE — Patient Instructions (Signed)
  Your sacral fracture is healing but it will take several more weeks for it to heal completely.  We will fit you with a back brace to wear with standing.  Hopefully this will help with the discomfort you are getting when having to stand for long periods.  I want you to get an x-ray before you see me again in 4 weeks.  You will go by [facility] imaging about 30 minutes before your appointment with me.  That way I will be able to review the x-ray at your appointment.

## 2021-06-23 ENCOUNTER — Other Ambulatory Visit (HOSPITAL_COMMUNITY): Payer: Self-pay

## 2021-06-23 NOTE — Progress Notes (Signed)
   Subjective:    Patient ID: Brenda Brady, female    DOB: December 08, 1975, 46 y.o.   MRN: [id number]  HPI  Patient is here for follow-up on a nondisplaced distal sacral fracture.  Her pain is improving.  Sitting has become more comfortable although sitting for long periods of time does still cause pain.  Majority of her pain occurs with prolonged standing.  She is getting pain not only in the area of her fracture but also some diffuse low back pain along the lower lumbar spine.  A recent CT scan of her pelvis which was ordered after a suspicious lesion was seen on previous x-ray shows that lesion to be benign.  There is evidence of her nondisplaced fracture of the distal sacrum but no other fractures are seen.    Review of Systems As above    Objective:   Physical Exam  Well-developed, well-nourished.  No acute distress  There is tenderness to palpation over the distal sacrum in the area of her fracture.  No other bony or soft tissue tenderness to palpation.  No spasm.  No focal neurological deficit of either lower extremity.      Assessment & Plan:   Nondisplaced distal sacral fracture-stable  Date of injury was April 5.  Her pain is slowly improving.  I think the diffuse low back pain that she is experiencing with standing is likely secondary to lower lumbar muscle strain from her injury.  She may benefit from a lumbar corset so we will fit her with that today.  She will follow-up with me again in 4 weeks and we will repeat x-rays of her sacrum at that time.  Call with questions or concerns in the interim.  This note was dictated using Dragon naturally speaking software and may contain errors in syntax, spelling, or content which have not been identified prior to signing this note.

## 2021-06-26 ENCOUNTER — Other Ambulatory Visit (HOSPITAL_COMMUNITY): Payer: Self-pay

## 2021-07-10 ENCOUNTER — Other Ambulatory Visit (HOSPITAL_COMMUNITY): Payer: Self-pay

## 2021-07-12 ENCOUNTER — Other Ambulatory Visit (HOSPITAL_COMMUNITY): Payer: Self-pay

## 2021-07-18 ENCOUNTER — Ambulatory Visit
Admission: RE | Admit: 2021-07-18 | Discharge: 2021-07-18 | Disposition: A | Discharge status: Home / Self Care | Payer: Self-pay | Source: Ambulatory Visit | Attending: Sports Medicine | Admitting: Sports Medicine

## 2021-07-18 DIAGNOSIS — S3210XD Unspecified fracture of sacrum, subsequent encounter for fracture with routine healing: Secondary | ICD-10-CM

## 2021-07-20 ENCOUNTER — Ambulatory Visit: Payer: 59 | Admitting: Sports Medicine

## 2021-07-20 VITALS — BP 102/68 | Ht <= 58 in | Wt 136.0 lb

## 2021-07-20 DIAGNOSIS — S3210XD Unspecified fracture of sacrum, subsequent encounter for fracture with routine healing: Secondary | ICD-10-CM

## 2021-07-20 NOTE — Progress Notes (Signed)
   Brenda Brady is a 46 y.o. female who presents to [facility] today for the following:  Follow up on non-displaced distal sacral fracture. Last seen in the clinic on 5/18, was fitted with a lumbar corset at the time due to concern for lower lumbar strain from injury.  She has since had repeat x-rays which show a healing sacral fracture, unchanged alignment. She reports pain is improving but still present with prolonged sitting or standing. Also present when laying down. 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 She is not taking anything for pain. Using the lumbar corset only when she has to sit for prolonged time.    PMH reviewed.  ROS as above. Medications reviewed.  Exam:  BP 102/68   Ht '4\' 9"'$  (1.448 m)   Wt 136 lb (61.7 kg)   BMI 29.43 kg/m  Gen: Well NAD MSK: Back Exam:  Inspection: Unremarkable  Palpable tenderness: None. Range of Motion:  Flexion 45 deg; Extension 45 deg; Side Bending to 45 deg bilaterally; Rotation to 45 deg bilaterally  Gait unremarkable.  DG Sacrum/Coccyx  Result Date: 07/18/2021 CLINICAL DATA:  Closed fracture of sacrum with routine healing. Fall 423. EXAM: SACRUM AND COCCYX - 2+ VIEW COMPARISON:  Radiograph 05/12/2021, CT 06/13/2021 FINDINGS: Known sacral fracture is unchanged in alignment. The fracture line remains visible but is less well-defined consistent with interval healing. There is no new fracture. No evident disruption of sacral ala. IMPRESSION: Healing sacral fracture, unchanged in alignment. Electronically Signed   By: Keith Rake M.D.   On: 07/18/2021 17:25     Assessment and Plan: 1. Closed fracture of sacrum with routine healing, unspecified portion of sacrum, subsequent encounter Healing fracture. She is improving though still not quite recovered. Would likely benefit from additional time off work.  - DG Sacrum/Coccyx; Future - Return in 4 weeks, can assess readiness to return to work at that time -  Can continue lumbar corset as needed for comfort  - Work note provided   Sharion Settler PGY-2 Family Medicine   Patient seen and evaluated with the resident.  I agree with the above plan of care.  X-ray shows healing of her nondisplaced sacral fracture.  Even though her pain is improving I do not believe she is quite ready to return to work.  Therefore, I will keep her out of work for another 4 weeks.  She will follow-up with me again at that time for repeat x-rays.

## 2021-08-03 ENCOUNTER — Other Ambulatory Visit (HOSPITAL_COMMUNITY): Payer: Self-pay

## 2021-08-07 ENCOUNTER — Other Ambulatory Visit (HOSPITAL_COMMUNITY): Payer: Self-pay

## 2021-08-09 ENCOUNTER — Other Ambulatory Visit (HOSPITAL_COMMUNITY): Payer: Self-pay

## 2021-08-10 ENCOUNTER — Other Ambulatory Visit (HOSPITAL_COMMUNITY): Payer: Self-pay

## 2021-08-14 ENCOUNTER — Ambulatory Visit
Admission: RE | Admit: 2021-08-14 | Discharge: 2021-08-14 | Disposition: A | Discharge status: Home / Self Care | Payer: No Typology Code available for payment source | Source: Ambulatory Visit | Attending: Sports Medicine | Admitting: Sports Medicine

## 2021-08-14 DIAGNOSIS — S3210XD Unspecified fracture of sacrum, subsequent encounter for fracture with routine healing: Secondary | ICD-10-CM

## 2021-08-17 ENCOUNTER — Ambulatory Visit: Payer: 59 | Admitting: Sports Medicine

## 2021-08-17 VITALS — BP 138/87 | Ht <= 58 in | Wt 138.0 lb

## 2021-08-17 DIAGNOSIS — S3210XD Unspecified fracture of sacrum, subsequent encounter for fracture with routine healing: Secondary | ICD-10-CM

## 2021-08-17 NOTE — Progress Notes (Signed)
   Subjective:    Patient ID: Brenda Brady, female    DOB: [birth year], 46 y.o.   MRN: [id number]  HPI  Patient presents today for follow-up on a nondisplaced sacral fracture.  Unfortunately she continues to have pain, particularly with sitting.  Some numbness down the posterior aspect of the right leg as well.  Repeat x-rays of the fracture show excellent callus consistent with a healing fracture.  Previous CT scan showed no other fractures.  History is obtained with the help of an interpreter.   Review of Systems As above    Objective:   Physical Exam   Well-developed, well-nourished.  She prefers to stand in the exam room as sitting is painful  She has good lumbar range of motion.  She is diffusely tender to palpation along the right lower spine near the right side of the sacrum but she is not extremely tender to palpation directly over the fracture site.  Neurological exam shows no focal neurological deficit of either lower extremity.  X-rays as above     Assessment & Plan:   3 months status post nondisplaced sacral fracture  Given the amount of healing seen on x-ray I would expect the patient to be doing better than she is.  Therefore, I would like to refer the patient to Dr. Louanne Skye for his input.  She is in agreement with that plan.  She will remain out of work until consultation with him.  I will defer further work-up and treatment to his discretion and the patient will follow-up with me as needed.  This note was dictated using Dragon naturally speaking software and may contain errors in syntax, spelling, or content which have not been identified prior to signing this note.

## 2021-09-05 ENCOUNTER — Other Ambulatory Visit: Payer: Self-pay | Admitting: Internal Medicine

## 2021-09-05 ENCOUNTER — Other Ambulatory Visit (HOSPITAL_COMMUNITY): Payer: Self-pay

## 2021-09-05 DIAGNOSIS — B181 Chronic viral hepatitis B without delta-agent: Secondary | ICD-10-CM

## 2021-09-05 NOTE — Telephone Encounter (Signed)
Please advise if okay to refill. 

## 2021-09-06 ENCOUNTER — Other Ambulatory Visit (HOSPITAL_COMMUNITY): Payer: Self-pay

## 2021-09-06 MED ORDER — VEMLIDY 25 MG PO TABS
1.0000 | ORAL_TABLET | Freq: Every day | ORAL | 3 refills | Status: DC
  Filled 2021-09-06: qty 30, 30d supply, fill #0

## 2021-09-07 ENCOUNTER — Other Ambulatory Visit (HOSPITAL_COMMUNITY): Payer: Self-pay

## 2021-09-08 ENCOUNTER — Other Ambulatory Visit (HOSPITAL_COMMUNITY): Payer: Self-pay

## 2021-09-08 ENCOUNTER — Telehealth: Payer: Self-pay

## 2021-09-08 DIAGNOSIS — B181 Chronic viral hepatitis B without delta-agent: Secondary | ICD-10-CM

## 2021-09-08 MED ORDER — VEMLIDY 25 MG PO TABS: 1.0000 | ORAL_TABLET | Freq: Every day | ORAL | 3 refills | Status: DC

## 2021-09-08 NOTE — Telephone Encounter (Signed)
-----   Message from Roney Jaffe, CPhT sent at 09/08/2021 11:39 AM EDT ----- Regarding: Angus Palms,  Would you be able to send this patient [facility] script to [facility] , [facility] can no longer fill her medication.   Thank You,  Ileene Patrick, [place] Patient [facility] Phone: [phone] Fax: [phone]

## 2021-09-08 NOTE — Telephone Encounter (Signed)
Vemlidy sent to [facility].   Beryle Flock, RN

## 2021-09-13 ENCOUNTER — Other Ambulatory Visit (HOSPITAL_COMMUNITY): Payer: Self-pay

## 2021-09-13 ENCOUNTER — Ambulatory Visit (INDEPENDENT_AMBULATORY_CARE_PROVIDER_SITE_OTHER): Payer: 59 | Admitting: Specialist

## 2021-09-13 ENCOUNTER — Encounter: Payer: Self-pay | Admitting: Specialist

## 2021-09-13 ENCOUNTER — Ambulatory Visit: Payer: 59 | Admitting: Surgery

## 2021-09-13 ENCOUNTER — Ambulatory Visit: Payer: 59 | Admitting: Specialist

## 2021-09-13 VITALS — BP 120/82 | HR 79 | Ht <= 58 in | Wt 138.0 lb

## 2021-09-13 DIAGNOSIS — S39013S Strain of muscle, fascia and tendon of pelvis, sequela: Secondary | ICD-10-CM

## 2021-09-13 DIAGNOSIS — S338XXS Sprain of other parts of lumbar spine and pelvis, sequela: Secondary | ICD-10-CM

## 2021-09-13 MED ORDER — NAPROXEN 500 MG PO TABS: 500.0000 mg | ORAL_TABLET | Freq: Two times a day (BID) | ORAL | 1 refills | Status: DC

## 2021-09-13 NOTE — Patient Instructions (Signed)
Plan: right pelvis is suffering from ligament and muscle injury. YTreatments are Weight loss, NSIADs like naprosyn and  stretching exercise. Well padded shoes help. Ice the right pelvis 2-3 times a day 15-20 mins at a time.3 times a day 15-20 mins at a time. Hot showers in the AM.  Injection with steroid may be of benefit. Hemp CBD capsules, amazon.com 5,000-7,000 mg per bottle, 60 capsules per bottle, take one capsule twice a day. Cane in the left hand to use with right leg weight bearing. Physical therapy for eval and treatment of the right pelvis contusion and sprain Follow-Up Instructions: No follow-ups on file.

## 2021-09-13 NOTE — Progress Notes (Signed)
Office Visit Note   Patient: Brenda Brady           Date of Birth: Jan 11, 1976           MRN: [id number] Visit Date: 09/13/2021              Requested by: Thurman Coyer, DO [address],  [place] [address] PCP: [name], MD   Assessment & Plan: Visit Diagnoses:  1. Sacrotuberous (ligament) sprain, sequela   2. Strain of muscle, fascia and tendon of pelvis, sequela     Plan: Plan: right pelvis is suffering from ligament and muscle injury. YTreatments are Weight loss, NSIADs like naprosyn and  stretching exercise. Well padded shoes help. Ice the right pelvis 2-3 times a day 15-20 mins at a time.3 times a day 15-20 mins at a time. Hot showers in the AM.  Injection with steroid may be of benefit. Hemp CBD capsules, amazon.com 5,000-7,000 mg per bottle, 60 capsules per bottle, take one capsule twice a day. Cane in the left hand to use with right leg weight bearing. Physical therapy for eval and treatment of the right pelvis contusion and sprain Follow-Up Instructions: No follow-ups on file.    Follow-Up Instructions: Return in about 6 weeks (around 10/25/2021).   Orders:  No orders of the defined types were placed in this encounter.  No orders of the defined types were placed in this encounter.     Procedures: No procedures performed   Clinical Data: No additional findings.   Subjective: Chief Complaint  Patient presents with   Pelvis - Fracture, Injury    DOI: 05/10/21 fell doing box jumps and landed on her bottom, taking tylenol for pain as needed, Pain :46/52    46 year old female with 4 month history of right buttock pain and coccygodynia. Pain started after a fall at a gymnasium 05/10/2021. At first with severe pain and right now she reports the pain is much better.     Review of Systems  Constitutional: Negative.   HENT: Negative.    Eyes: Negative.   Respiratory: Negative.    Cardiovascular: Negative.   Gastrointestinal: Negative.    Endocrine: Negative.   Genitourinary: Negative.   Musculoskeletal: Negative.   Skin: Negative.   Allergic/Immunologic: Negative.   Neurological: Negative.   Hematological: Negative.   Psychiatric/Behavioral: Negative.       Objective: Vital Signs: BP 120/82 (BP Location: Left Arm, Patient Position: Sitting)   Pulse 79   Ht '4\' 9"'$  (1.448 m)   Wt 138 lb (62.6 kg)   BMI 29.86 kg/m   Physical Exam Constitutional:      Appearance: She is well-developed.  HENT:     Head: Normocephalic and atraumatic.  Eyes:     Pupils: Pupils are equal, round, and reactive to light.  Pulmonary:     Effort: Pulmonary effort is normal.     Breath sounds: Normal breath sounds.  Abdominal:     General: Bowel sounds are normal.     Palpations: Abdomen is soft.  Musculoskeletal:        General: Normal range of motion.     Cervical back: Normal range of motion and neck supple.  Skin:    General: Skin is warm and dry.  Neurological:     Mental Status: She is alert and oriented to person, place, and time.  Psychiatric:        Behavior: Behavior normal.        Thought Content:  Thought content normal.        Judgment: Judgment normal.     Ortho Exam  Specialty Comments:  No specialty comments available.  Imaging: No results found.   PMFS History: Patient Active Problem List   Diagnosis Date Noted   Coccydynia 06/02/2021   Lytic lesion of bone on x-ray 06/02/2021   Encounter for annual physical exam 12/24/2020   Microcytic anemia 12/24/2020   Elevated BP without diagnosis of hypertension 12/24/2020   Allergic rhinitis 12/24/2020   COVID-19 12/02/2019   Chronic active viral hepatitis B ([place]) 11/05/2012   OVERWEIGHT 02/07/2009   Past Medical History:  Diagnosis Date   BMI 29.0-29.9,adult    Hepatitis B infection    Hepatitis B, chronic (HCC)    Hepatitis B, chronic ([place])     History reviewed. No pertinent family history.  Past Surgical History:  Procedure Laterality Date    VAGINAL DELIVERY     Social History   Occupational History   Not on file  Tobacco Use   Smoking status: Never   Smokeless tobacco: Never  Substance and Sexual Activity   Alcohol use: No   Drug use: No   Sexual activity: Yes    Birth control/protection: I.U.D.

## 2021-09-15 ENCOUNTER — Telehealth: Payer: Self-pay

## 2021-09-15 NOTE — Telephone Encounter (Signed)
[facility] for [facility] faxed to [facility]. Awaiting response.   Beryle Flock, RN

## 2021-09-18 ENCOUNTER — Telehealth: Payer: Self-pay

## 2021-09-18 ENCOUNTER — Other Ambulatory Visit (HOSPITAL_COMMUNITY): Payer: Self-pay

## 2021-09-18 NOTE — Telephone Encounter (Signed)
[facility] Patient Advocate Encounter  Prior Authorization for Shirlee Latch has been approved.    Kashvi# [id number] Effective dates: 09/17/21 through 09/18/22  Prescription will need to be filled through [facility] .  [facility] will continue to follow.  Ileene Patrick, [facility] Patient [facility] Phone: [phone] Fax:  [phone]

## 2021-09-27 ENCOUNTER — Ambulatory Visit: Payer: 59 | Attending: Specialist | Admitting: Rehabilitative and Restorative Service Providers"

## 2021-09-27 ENCOUNTER — Encounter: Payer: Self-pay | Admitting: Rehabilitative and Restorative Service Providers"

## 2021-09-27 ENCOUNTER — Other Ambulatory Visit: Payer: Self-pay

## 2021-09-27 DIAGNOSIS — M533 Sacrococcygeal disorders, not elsewhere classified: Secondary | ICD-10-CM | POA: Diagnosis not present

## 2021-09-27 DIAGNOSIS — S338XXS Sprain of other parts of lumbar spine and pelvis, sequela: Secondary | ICD-10-CM | POA: Insufficient documentation

## 2021-09-27 DIAGNOSIS — S39013S Strain of muscle, fascia and tendon of pelvis, sequela: Secondary | ICD-10-CM | POA: Diagnosis not present

## 2021-09-27 DIAGNOSIS — M6281 Muscle weakness (generalized): Secondary | ICD-10-CM | POA: Diagnosis not present

## 2021-09-27 NOTE — Therapy (Signed)
OUTPATIENT PHYSICAL THERAPY LOWER EXTREMITY EVALUATION   Patient Name: Brenda Brady MRN: [id number] DOB:[birth year], 46 y.o., female Today's Date: 09/27/2021   PT End of Session - 09/27/21 1152     Visit Number 1    Date for PT Re-Evaluation 11/17/21    Authorization Type [employer]    PT Start Time 5102    PT Stop Time 1225    PT Time Calculation (min) 40 min    Activity Tolerance Patient tolerated treatment well    Behavior During Therapy WFL for tasks assessed/performed             Past Medical History:  Diagnosis Date   BMI 29.0-29.9,adult    Hepatitis B infection    Hepatitis B, chronic ([place])    Hepatitis B, chronic ([place])    Past Surgical History:  Procedure Laterality Date   VAGINAL DELIVERY     Patient Active Problem List   Diagnosis Date Noted   Coccydynia 06/02/2021   Lytic lesion of bone on x-ray 06/02/2021   Encounter for annual physical exam 12/24/2020   Microcytic anemia 12/24/2020   Elevated BP without diagnosis of hypertension 12/24/2020   Allergic rhinitis 12/24/2020   COVID-19 12/02/2019   Chronic active viral hepatitis B ([place]) 11/05/2012   OVERWEIGHT 02/07/2009    PCP: [name], MD  REFERRING PROVIDER: Jessy Oto, MD   REFERRING DIAG: S33.8XXS (ICD-10-CM) - Sacrotuberous (ligament) sprain, sequela S39.013S (ICD-10-CM) - Strain of muscle, fascia and tendon of pelvis, sequela   THERAPY DIAG:  Muscle weakness (generalized)  Sacral pain  Rationale for Evaluation and Treatment Rehabilitation  ONSET DATE: 05/10/2021  SUBJECTIVE:   SUBJECTIVE STATEMENT: Pt initially had injury back in April when she was performing a box jump at the gym.  Pt initially sustained a sacral fracture, which has since healed, but she also sustained muscle sprain which is leading to continued pain.  PERTINENT HISTORY: Hepatitis B  PAIN:  Are you having pain? Yes: NPRS scale: 0-5/10 Pain location: sacral region into hips Pain description: sore,  aching Aggravating factors: sleeping on her side Relieving factors: movement  PRECAUTIONS: None  WEIGHT BEARING RESTRICTIONS No  FALLS:  Has patient fallen in last 6 months? Yes. Number of falls 1 fall at the gym  LIVING ENVIRONMENT: Lives with: lives with their spouse Lives in: House/apartment Has following equipment at home: None  OCCUPATION: Nail Tech, currently out due to pain  PLOF: Independent  PATIENT GOALS:  To be able to return to work without pain.   OBJECTIVE:   DIAGNOSTIC FINDINGS:  Sacral radiograph on 08/14/2021 Progressive healing callus seen at distal sacral fracture in unchanged alignment.  PATIENT SURVEYS:  LEFS  37 / 80 = 46.3 %  COGNITION:  Overall cognitive status: Within functional limits for tasks assessed     SENSATION: WFL  MUSCLE LENGTH: Hamstrings: tightness bilat with pain  POSTURE: No Significant postural limitations  PALPATION: Tender to palpation over sacral region  LOWER EXTREMITY ROM: WFL  LOWER EXTREMITY MMT: B hip strength of 4/5, B hamstring strength 4/5, B quad strength 5/5  FUNCTIONAL TESTS:  5 times sit to stand: 12.99 sec with limited UE use on thighs  GAIT: Distance walked: >500 ft Assistive device utilized: None Level of assistance: Complete Independence Comments: Antalgic gait noted, with some imbalance    TODAY'S TREATMENT: 09/27/2021:  Reviewed HEP   PATIENT EDUCATION:  Education details: Issued HEP Person educated: Patient Education method: Explanation, Demonstration, and Handouts Education comprehension: verbalized understanding and returned demonstration  HOME EXERCISE PROGRAM: Access Code: [email or web address] URL: [email or web address] Date: 09/27/2021 Prepared by: Shelby Dubin Tollie Canada  Exercises - Seated Table Hamstring Stretch  - 1 x daily - 7 x weekly - 1 sets - 3 reps - 20 sec hold - Seated Piriformis Stretch  - 1 x daily - 7 x weekly - 1 sets - 3 reps - 20 sec hold - Supine Bridge  - 1  x daily - 7 x weekly - 2 sets - 10 reps - Seated Pelvic Tilt  - 1 x daily - 7 x weekly - 2 sets - 10 reps - Seated March  - 1 x daily - 7 x weekly - 2 sets - 10 reps - Supine Pelvic Tilt  - 1 x daily - 7 x weekly - 2 sets - 10 reps  ASSESSMENT:  CLINICAL IMPRESSION: Patient is a 46 y.o. female who was seen today for physical therapy evaluation and treatment for sacral fracture and muscle strain to pelvis. Pt's radiographs reveal that her fracture has healed, but she continues to have increased pain that has limited her to being unable to work at this time.  Pts PLOF was able to work and exercise at the gym without pain.  Pt presents with increased sacral pain, muscle weakness, muscle tightness, and decreased dynamic balance.  Pt would benefit from skilled PT to address her functional impairments.   OBJECTIVE IMPAIRMENTS decreased balance, decreased strength, increased muscle spasms, impaired flexibility, and pain.   ACTIVITY LIMITATIONS bending, standing, and squatting  PARTICIPATION LIMITATIONS: community activity and occupation  PERSONAL FACTORS Time since onset of injury/illness/exacerbation and 1 comorbidity: Hepatitis B  are also affecting patient's functional outcome.   REHAB POTENTIAL: Good  CLINICAL DECISION MAKING: Stable/uncomplicated  EVALUATION COMPLEXITY: Low   GOALS: Goals reviewed with patient? Yes  SHORT TERM GOALS: Target date: 10/18/2021  Pt will be independent with initial HEP. Baseline: Goal status: INITIAL   LONG TERM GOALS: Target date: 11/17/2021   Pt will be independent with advanced HEP. Baseline:  Goal status: INITIAL  2.  Pt will increase bilateral hip and hamstring strength to at least 5-/5 to allow her to return to the gym. Baseline:  Goal status: INITIAL  3.  Pt will report the ability to return to work with pain no greater than 3/10. Baseline:  Goal status: INITIAL  4.  Pt will have LEFS score of at least 60% to indicate improvements in  functional mobility. Baseline:  Goal status: INITIAL    PLAN: PT FREQUENCY: 1-2x/week  PT DURATION: 8 weeks  PLANNED INTERVENTIONS: Therapeutic exercises, Therapeutic activity, Neuromuscular re-education, Balance training, Gait training, Patient/Family education, Self Care, Joint mobilization, Joint manipulation, Stair training, Aquatic Therapy, Dry Needling, Electrical stimulation, Spinal manipulation, Spinal mobilization, Cryotherapy, Moist heat, Taping, Ultrasound, Ionotophoresis '4mg'$ /ml Dexamethasone, Manual therapy, and Re-evaluation  PLAN FOR NEXT SESSION: assess and progress HEP as indicated, strengthening, flexibility   Rimas Gilham, PT 09/27/2021, 11:56 AM

## 2021-10-04 ENCOUNTER — Ambulatory Visit: Payer: 59 | Admitting: Rehabilitative and Restorative Service Providers"

## 2021-10-11 ENCOUNTER — Ambulatory Visit: Payer: 59 | Attending: Specialist | Admitting: Rehabilitative and Restorative Service Providers"

## 2021-10-11 ENCOUNTER — Encounter: Payer: Self-pay | Admitting: Rehabilitative and Restorative Service Providers"

## 2021-10-11 DIAGNOSIS — M533 Sacrococcygeal disorders, not elsewhere classified: Secondary | ICD-10-CM | POA: Insufficient documentation

## 2021-10-11 DIAGNOSIS — M6281 Muscle weakness (generalized): Secondary | ICD-10-CM | POA: Insufficient documentation

## 2021-10-11 NOTE — Therapy (Signed)
OUTPATIENT PHYSICAL THERAPY TREATMENT NOTE   Patient Name: Brenda Brady MRN: [id number] DOB:04/26/1975, 46 y.o., female Today's Date: 10/11/2021   PT End of Session - 10/11/21 1231     Visit Number 2    Date for PT Re-Evaluation 11/17/21    Authorization Type Aet[employer]    PT Start Time 1230    PT Stop Time 1308    PT Time Calculation (min) 38 min    Activity Tolerance Patient tolerated treatment well    Behavior During Therapy WFL for tasks assessed/performed             Past Medical History:  Diagnosis Date   BMI 29.0-29.9,adult    Hepatitis B infection    Hepatitis B, chronic ([place])    Hepatitis B, chronic ([place])    Past Surgical History:  Procedure Laterality Date   VAGINAL DELIVERY     Patient Active Problem List   Diagnosis Date Noted   Coccydynia 06/02/2021   Lytic lesion of bone on x-ray 06/02/2021   Encounter for annual physical exam 12/24/2020   Microcytic anemia 12/24/2020   Elevated BP without diagnosis of hypertension 12/24/2020   Allergic rhinitis 12/24/2020   COVID-19 12/02/2019   Chronic active viral hepatitis B ([place]) 11/05/2012   OVERWEIGHT 02/07/2009    PCP: [name], MD  REFERRING PROVIDER: Jessy Oto, MD   REFERRING DIAG: S33.8XXS (ICD-10-CM) - Sacrotuberous (ligament) sprain, sequela S39.013S (ICD-10-CM) - Strain of muscle, fascia and tendon of pelvis, sequela   THERAPY DIAG:  Muscle weakness (generalized)  Sacral pain  Rationale for Evaluation and Treatment Rehabilitation  ONSET DATE: 05/10/2021  SUBJECTIVE:   SUBJECTIVE STATEMENT: Pt reports that she has more pain when she has been sitting for prolonged periods. Pt with spouse and interpreter present during session  PERTINENT HISTORY: Hepatitis B  PAIN:  Are you having pain? Yes: NPRS scale: 4/10 Pain location: sacral region into hips Pain description: sore, aching Aggravating factors: sleeping on her side Relieving factors: movement  PRECAUTIONS: None  WEIGHT BEARING  RESTRICTIONS No  FALLS:  Has patient fallen in last 6 months? Yes. Number of falls 1 fall at the gym  LIVING ENVIRONMENT: Lives with: lives with their spouse Lives in: House/apartment Has following equipment at home: None  OCCUPATION: Nail Tech, currently out due to pain  PLOF: Independent  PATIENT GOALS:  To be able to return to work without pain.   OBJECTIVE:   DIAGNOSTIC FINDINGS:  Sacral radiograph on 08/14/2021 Progressive healing callus seen at distal sacral fracture in unchanged alignment.  PATIENT SURVEYS:  LEFS  37 / 80 = 46.3 %  COGNITION:  Overall cognitive status: Within functional limits for tasks assessed     SENSATION: WFL  MUSCLE LENGTH: Hamstrings: tightness bilat with pain  POSTURE: No Significant postural limitations  PALPATION: Tender to palpation over sacral region  LOWER EXTREMITY ROM: WFL  LOWER EXTREMITY MMT: B hip strength of 4/5, B hamstring strength 4/5, B quad strength 5/5  FUNCTIONAL TESTS:  5 times sit to stand: 12.99 sec with limited UE use on thighs  GAIT: Distance walked: >500 ft Assistive device utilized: None Level of assistance: Complete Independence Comments: Antalgic gait noted, with some imbalance    TODAY'S TREATMENT: 10/11/2021: Nustep level 4 x6 min with PT present to discuss status Seated piriformis stretch 2x20 sec bilat Seated table hamstring stretch 2x20 sec bilat Seated with 2#:  marching, LAQ, heel/toe raises, hip abduction scissors.  BLE 2x10 Supine: bridging, posterior pelvic tilt, posterior pelvic tilt  with marching. 2x10 each Lower trunk rotation x10 bilat Sidelying clamshells and reverse clamshells 2x10 bilat Prone:  HS curl and hip extension.  BLE 2x10 each Sit to/from stand 2x10 Seated hip ER 2x10 bilat  09/27/2021:  Reviewed HEP   PATIENT EDUCATION:  Education details: Issued HEP Person educated: Patient Education method: Explanation, Demonstration, and Handouts Education comprehension:  verbalized understanding and returned demonstration   HOME EXERCISE PROGRAM: Access Code: [email or web address] URL: [email or web address] Date: 10/11/2021 Prepared by: Shelby Dubin Elin Fenley  Exercises - Seated Table Hamstring Stretch  - 1 x daily - 7 x weekly - 1 sets - 3 reps - 20 sec hold - Seated Piriformis Stretch  - 1 x daily - 7 x weekly - 1 sets - 3 reps - 20 sec hold - Seated Pelvic Tilt  - 1 x daily - 7 x weekly - 2 sets - 10 reps - Seated March  - 1 x daily - 7 x weekly - 2 sets - 10 reps - Supine Bridge  - 1 x daily - 7 x weekly - 2 sets - 10 reps - Supine Pelvic Tilt  - 1 x daily - 7 x weekly - 2 sets - 10 reps - Supine March with Posterior Pelvic Tilt  - 1 x daily - 7 x weekly - 2 sets - 10 reps - Clamshell  - 1 x daily - 7 x weekly - 2 sets - 10 reps - Supine Lower Trunk Rotation  - 1 x daily - 7 x weekly - 1 sets - 10 reps - Sidelying Reverse Clamshell  - 1 x daily - 7 x weekly - 2 sets - 10 reps - Prone Knee Flexion  - 1 x daily - 7 x weekly - 2 sets - 10 reps - Prone Hip Extension  - 1 x daily - 7 x weekly - 2 sets - 10 reps  ASSESSMENT:  CLINICAL IMPRESSION: Patient presents to skilled PT with reports of compliance with HEP, but stating that she still has some pain with prolonged sitting.  Pt able to complete exercises during session with cuing for technique and pacing.  Pt some difficulty maintaining correct positioning during sidelying clamshells, but with verbal and tactile cuing able to perform.  Pt issued updated HEP handout during session today.  Pt continues to require skilled PT to progress towards goal related activities.   OBJECTIVE IMPAIRMENTS decreased balance, decreased strength, increased muscle spasms, impaired flexibility, and pain.   ACTIVITY LIMITATIONS bending, standing, and squatting  PARTICIPATION LIMITATIONS: community activity and occupation  PERSONAL FACTORS Time since onset of injury/illness/exacerbation and 1 comorbidity: Hepatitis B  are also  affecting patient's functional outcome.   REHAB POTENTIAL: Good  CLINICAL DECISION MAKING: Stable/uncomplicated  EVALUATION COMPLEXITY: Low   GOALS: Goals reviewed with patient? Yes  SHORT TERM GOALS: Target date: 10/18/2021  Pt will be independent with initial HEP. Baseline: Goal status: MET   LONG TERM GOALS: Target date: 11/17/2021   Pt will be independent with advanced HEP. Baseline:  Goal status: INITIAL  2.  Pt will increase bilateral hip and hamstring strength to at least 5-/5 to allow her to return to the gym. Baseline:  Goal status: INITIAL  3.  Pt will report the ability to return to work with pain no greater than 3/10. Baseline:  Goal status: INITIAL  4.  Pt will have LEFS score of at least 60% to indicate improvements in functional mobility. Baseline:  Goal status: INITIAL    PLAN: PT FREQUENCY: 1-2x/week  PT DURATION: 8 weeks  PLANNED INTERVENTIONS: Therapeutic exercises, Therapeutic activity, Neuromuscular re-education, Balance training, Gait training, Patient/Family education, Self Care, Joint mobilization, Joint manipulation, Stair training, Aquatic Therapy, Dry Needling, Electrical stimulation, Spinal manipulation, Spinal mobilization, Cryotherapy, Moist heat, Taping, Ultrasound, Ionotophoresis 36m/ml Dexamethasone, Manual therapy, and Re-evaluation  PLAN FOR NEXT SESSION: assess and progress HEP as indicated, strengthening, flexibility   SJuel Burrow PT 10/11/2021, 1:19 PM   [facility] [place]elinaGKann[place] [address]Phone # [phone] [phone]

## 2021-10-19 ENCOUNTER — Ambulatory Visit: Payer: 59 | Admitting: Rehabilitative and Restorative Service Providers"

## 2021-10-19 ENCOUNTER — Encounter: Payer: Self-pay | Admitting: Rehabilitative and Restorative Service Providers"

## 2021-10-19 DIAGNOSIS — M6281 Muscle weakness (generalized): Secondary | ICD-10-CM

## 2021-10-19 DIAGNOSIS — M533 Sacrococcygeal disorders, not elsewhere classified: Secondary | ICD-10-CM

## 2021-10-19 NOTE — Therapy (Signed)
OUTPATIENT PHYSICAL THERAPY TREATMENT NOTE   Patient Name: Brenda Brady MRN: [id number] DOB:10-09-1975, 46 y.o., female Today's Date: 10/19/2021   PT End of Session - 10/19/21 1111     Visit Number 3    Date for PT Re-Evaluation 11/17/21    Authorization Type [employer]et[employer]    PT Start Time 1100    PT Stop Time 1140    PT Time Calculation (min) 40 min    Activity Tolerance Patient tolerated treatment well    Behavior During Therapy WFL for tasks assessed/performed             Past Medical History:  Diagnosis Date   BMI 29.0-29.9,adult    Hepatitis B infection    Hepatitis B, chronic ([place])    Hepatitis B, chronic ([place])    Past Surgical History:  Procedure Laterality Date   VAGINAL DELIVERY     Patient Active Problem List   Diagnosis Date Noted   Coccydynia 06/02/2021   Lytic lesion of bone on x-ray 06/02/2021   Encounter for annual physical exam 12/24/2020   Microcytic anemia 12/24/2020   Elevated BP without diagnosis of hypertension 12/24/2020   Allergic rhinitis 12/24/2020   COVID-19 12/02/2019   Chronic active viral hepatitis B ([place]) 11/05/2012   OVERWEIGHT 02/07/2009    PCP: [name], MD  REFERRING PROVIDER: Jessy Oto, MD   REFERRING DIAG: S33.8XXS (ICD-10-CM) - Sacrotuberous (ligament) sprain, sequela S39.013S (ICD-10-CM) - Strain of muscle, fascia and tendon of pelvis, sequela   THERAPY DIAG:  Muscle weakness (generalized)  Sacral pain  Rationale for Evaluation and Treatment Rehabilitation  ONSET DATE: 05/10/2021  SUBJECTIVE:   SUBJECTIVE STATEMENT: Pt reports that she is progressing with decreased thigh pain Pt with interpreter present during session  PERTINENT HISTORY: Hepatitis B  PAIN:  Are you having pain? Yes: NPRS scale: 2/10 Pain location: sacral region into hips Pain description: sore, aching Aggravating factors: sleeping on her side Relieving factors: movement  PRECAUTIONS: None  WEIGHT BEARING RESTRICTIONS No  FALLS:  Has  patient fallen in last 6 months? Yes. Number of falls 1 fall at the gym  LIVING ENVIRONMENT: Lives with: lives with their spouse Lives in: House/apartment Has following equipment at home: None  OCCUPATION: Nail Tech, currently out due to pain  PLOF: Independent  PATIENT GOALS:  To be able to return to work without pain.   OBJECTIVE:   DIAGNOSTIC FINDINGS:  Sacral radiograph on 08/14/2021 Progressive healing callus seen at distal sacral fracture in unchanged alignment.  PATIENT SURVEYS:  LEFS  37 / 80 = 46.3 %  COGNITION:  Overall cognitive status: Within functional limits for tasks assessed     SENSATION: WFL  MUSCLE LENGTH: Hamstrings: tightness bilat with pain  POSTURE: No Significant postural limitations  PALPATION: Tender to palpation over sacral region  LOWER EXTREMITY ROM: WFL  LOWER EXTREMITY MMT: B hip strength of 4/5, B hamstring strength 4/5, B quad strength 5/5  FUNCTIONAL TESTS:  5 times sit to stand: 12.99 sec with limited UE use on thighs  GAIT: Distance walked: >500 ft Assistive device utilized: None Level of assistance: Complete Independence Comments: Antalgic gait noted, with some imbalance    TODAY'S TREATMENT: 10/19/2021: Recumbent bike level 2 x6 min with PT present to discuss status Seated piriformis stretch 2x20 sec bilat Seated table hamstring stretch 2x20 sec bilat 4 way pelvic tilt seated on dynadisc 2x10 bilat Supine IT band stretch then hip adductor stretch 2x20 sec bilat each Supine bridges with blue loop around knees  2x10 Sidelying clamshells with blue loop and reverse clamshells 2x10 bilat Prone hip extension 2x10 bilat Farmer's carry with 15# kettlebell in unilateral hand 3x10 ft bilat Lunge walking 4x10 ft   10/11/2021: Nustep level 4 x6 min with PT present to discuss status Seated piriformis stretch 2x20 sec bilat Seated table hamstring stretch 2x20 sec bilat Seated with 2#:  marching, LAQ, heel/toe raises, hip  abduction scissors.  BLE 2x10 Supine: bridging, posterior pelvic tilt, posterior pelvic tilt with marching. 2x10 each Lower trunk rotation x10 bilat Sidelying clamshells and reverse clamshells 2x10 bilat Prone:  HS curl and hip extension.  BLE 2x10 each Sit to/from stand 2x10 Seated hip ER 2x10 bilat  09/27/2021:  Reviewed HEP   PATIENT EDUCATION:  Education details: Issued HEP Person educated: Patient Education method: Explanation, Demonstration, and Handouts Education comprehension: verbalized understanding and returned demonstration   HOME EXERCISE PROGRAM: Access Code: [email or web address] URL: [email or web address] Date: 10/11/2021 Prepared by: Shelby Dubin Ajay Strubel  Exercises - Seated Table Hamstring Stretch  - 1 x daily - 7 x weekly - 1 sets - 3 reps - 20 sec hold - Seated Piriformis Stretch  - 1 x daily - 7 x weekly - 1 sets - 3 reps - 20 sec hold - Seated Pelvic Tilt  - 1 x daily - 7 x weekly - 2 sets - 10 reps - Seated March  - 1 x daily - 7 x weekly - 2 sets - 10 reps - Supine Bridge  - 1 x daily - 7 x weekly - 2 sets - 10 reps - Supine Pelvic Tilt  - 1 x daily - 7 x weekly - 2 sets - 10 reps - Supine March with Posterior Pelvic Tilt  - 1 x daily - 7 x weekly - 2 sets - 10 reps - Clamshell  - 1 x daily - 7 x weekly - 2 sets - 10 reps - Supine Lower Trunk Rotation  - 1 x daily - 7 x weekly - 1 sets - 10 reps - Sidelying Reverse Clamshell  - 1 x daily - 7 x weekly - 2 sets - 10 reps - Prone Knee Flexion  - 1 x daily - 7 x weekly - 2 sets - 10 reps - Prone Hip Extension  - 1 x daily - 7 x weekly - 2 sets - 10 reps  ASSESSMENT:  CLINICAL IMPRESSION: Patient presents to skilled PT with reports of compliance with HEP and states that she is feeling better. Pt continues to progress with strengthening exercises and decreased pain overall.  Pt able to progress with functional strengthening tasks.  Pt continues compliance with HEP.  Pt continues to require skilled PT to progress  towards goal related activities.   OBJECTIVE IMPAIRMENTS decreased balance, decreased strength, increased muscle spasms, impaired flexibility, and pain.   ACTIVITY LIMITATIONS bending, standing, and squatting  PARTICIPATION LIMITATIONS: community activity and occupation  PERSONAL FACTORS Time since onset of injury/illness/exacerbation and 1 comorbidity: Hepatitis B  are also affecting patient's functional outcome.   REHAB POTENTIAL: Good  CLINICAL DECISION MAKING: Stable/uncomplicated  EVALUATION COMPLEXITY: Low   GOALS: Goals reviewed with patient? Yes  SHORT TERM GOALS: Target date: 10/18/2021  Pt will be independent with initial HEP. Baseline: Goal status: MET   LONG TERM GOALS: Target date: 11/17/2021   Pt will be independent with advanced HEP. Baseline:  Goal status: IN PROGRESS  2.  Pt will increase bilateral hip and hamstring strength to at least 5-/5 to allow her to return to  the gym. Baseline:  Goal status: INITIAL  3.  Pt will report the ability to return to work with pain no greater than 3/10. Baseline:  Goal status: INITIAL  4.  Pt will have LEFS score of at least 60% to indicate improvements in functional mobility. Baseline:  Goal status: INITIAL    PLAN: PT FREQUENCY: 1-2x/week  PT DURATION: 8 weeks  PLANNED INTERVENTIONS: Therapeutic exercises, Therapeutic activity, Neuromuscular re-education, Balance training, Gait training, Patient/Family education, Self Care, Joint mobilization, Joint manipulation, Stair training, Aquatic Therapy, Dry Needling, Electrical stimulation, Spinal manipulation, Spinal mobilization, Cryotherapy, Moist heat, Taping, Ultrasound, Ionotophoresis 60m/ml Dexamethasone, Manual therapy, and Re-evaluation  PLAN FOR NEXT SESSION: assess and progress HEP as indicated, strengthening, flexibility   SJuel Burrow PT 10/19/2021, 12:56 PM   [facility] [address] SIron StationGWales[place]  [address]Phone # [phone] [phone]

## 2021-10-24 ENCOUNTER — Ambulatory Visit: Payer: 59 | Admitting: Rehabilitative and Restorative Service Providers"

## 2021-10-24 ENCOUNTER — Encounter: Payer: Self-pay | Admitting: Rehabilitative and Restorative Service Providers"

## 2021-10-24 DIAGNOSIS — M533 Sacrococcygeal disorders, not elsewhere classified: Secondary | ICD-10-CM

## 2021-10-24 DIAGNOSIS — M6281 Muscle weakness (generalized): Secondary | ICD-10-CM | POA: Diagnosis not present

## 2021-10-24 NOTE — Therapy (Signed)
OUTPATIENT PHYSICAL THERAPY TREATMENT NOTE   Patient Name: Brenda Brady MRN: [id number] DOB:[birth year], 46 y.o., female Today's Date: 10/24/2021   PT End of Session - 10/24/21 1017     Visit Number 4    Date for PT Re-Evaluation 11/17/21    Authorization Type Aetna    PT Start Time 1015    PT Stop Time 1059    PT Time Calculation (min) 44 min    Activity Tolerance Patient tolerated treatment well    Behavior During Therapy WFL for tasks assessed/performed             Past Medical History:  Diagnosis Date   BMI 29.0-29.9,adult    Hepatitis B infection    Hepatitis B, chronic ([place])    Hepatitis B, chronic ([place])    Past Surgical History:  Procedure Laterality Date   VAGINAL DELIVERY     Patient Active Problem List   Diagnosis Date Noted   Coccydynia 06/02/2021   Lytic lesion of bone on x-ray 06/02/2021   Encounter for annual physical exam 12/24/2020   Microcytic anemia 12/24/2020   Elevated BP without diagnosis of hypertension 12/24/2020   Allergic rhinitis 12/24/2020   COVID-19 12/02/2019   Chronic active viral hepatitis B ([place]) 11/05/2012   OVERWEIGHT 02/07/2009    PCP: [name], MD  REFERRING PROVIDER: Jessy Oto, MD   REFERRING DIAG: S33.8XXS (ICD-10-CM) - Sacrotuberous (ligament) sprain, sequela S39.013S (ICD-10-CM) - Strain of muscle, fascia and tendon of pelvis, sequela   THERAPY DIAG:  Muscle weakness (generalized)  Sacral pain  Rationale for Evaluation and Treatment Rehabilitation  ONSET DATE: 05/10/2021  SUBJECTIVE:   SUBJECTIVE STATEMENT: Pt reports that she still has some difficulty with bridging and some pain with prolonged sitting.  Otherwise, she is having less pain and feeling better.  Pt denies current pain. Pt with interpreter present during session  PERTINENT HISTORY: Hepatitis B  PAIN:  Are you having pain? Yes: NPRS scale: 0-2/10 Pain location: sacral region into hips Pain description: sore, aching Aggravating factors:  sleeping on her side Relieving factors: movement  PRECAUTIONS: None  WEIGHT BEARING RESTRICTIONS No  FALLS:  Has patient fallen in last 6 months? Yes. Number of falls 1 fall at the gym  LIVING ENVIRONMENT: Lives with: lives with their spouse Lives in: House/apartment Has following equipment at home: None  OCCUPATION: Nail Tech, currently out due to pain  PLOF: Independent  PATIENT GOALS:  To be able to return to work without pain.   OBJECTIVE:   DIAGNOSTIC FINDINGS:  Sacral radiograph on 08/14/2021 Progressive healing callus seen at distal sacral fracture in unchanged alignment.  PATIENT SURVEYS:  LEFS  37 / 80 = 46.3 %  COGNITION:  Overall cognitive status: Within functional limits for tasks assessed     SENSATION: WFL  MUSCLE LENGTH: Hamstrings: tightness bilat with pain  POSTURE: No Significant postural limitations  PALPATION: Tender to palpation over sacral region  LOWER EXTREMITY ROM: WFL  LOWER EXTREMITY MMT: B hip strength of 4/5, B hamstring strength 4/5, B quad strength 5/5  FUNCTIONAL TESTS:  5 times sit to stand: 12.99 sec with limited UE use on thighs  GAIT: Distance walked: >500 ft Assistive device utilized: None Level of assistance: Complete Independence Comments: Antalgic gait noted, with some imbalance    TODAY'S TREATMENT: 10/24/2021: Lower trunk rotation x10 Hooklying with red loop around knees performing marching (cuing for decreased pacing) 2x10 Hooklying clamshells with red loop 2x10 Supine bridges with red loop around knees 2x10 Supine  bridging with hip abduction clamshell with red loop 2x10 Hooklying with alt knee and hand press together with red pball for TA contraction x10 Dying bug with red pball x10 Sit to stand holding 4# weight in each hand x10 with cuing to control eccentric contraction Sit to stand holding 4# weight with overhead press x10 Standing with 8# dumbbell in goblet position performing high marching  2x10 Standing with 8# dumbbell in goblet position performing high march with trunk rotation x10 Standing hip hiking on 2" step 2x15 bilat Side stepping with red loop around thighs 3x10 ft Mini lunges fwd and backwards 3x10 ft  Side lunges and backward lunges with one foot on slider x10 each bilat with cuing for glute activation Mini squats standing on blue foam 2x10 with cuing for technique   10/19/2021: Recumbent bike level 2 x6 min with PT present to discuss status Seated piriformis Brady 2x20 sec bilat Seated table hamstring Brady 2x20 sec bilat 4 way pelvic tilt seated on dynadisc 2x10 bilat Supine IT band Brady then hip adductor Brady 2x20 sec bilat each Supine bridges with blue loop around knees 2x10 Sidelying clamshells with blue loop and reverse clamshells 2x10 bilat Prone hip extension 2x10 bilat Farmer's carry with 15# kettlebell in unilateral hand 3x10 ft bilat Lunge walking 4x10 ft   10/11/2021: Nustep level 4 x6 min with PT present to discuss status Seated piriformis Brady 2x20 sec bilat Seated table hamstring Brady 2x20 sec bilat Seated with 2#:  marching, LAQ, heel/toe raises, hip abduction scissors.  BLE 2x10 Supine: bridging, posterior pelvic tilt, posterior pelvic tilt with marching. 2x10 each Lower trunk rotation x10 bilat Sidelying clamshells and reverse clamshells 2x10 bilat Prone:  HS curl and hip extension.  BLE 2x10 each Sit to/from stand 2x10 Seated hip ER 2x10 bilat    PATIENT EDUCATION:  Education details: Issued HEP Person educated: Patient Education method: Explanation, Demonstration, and Handouts Education comprehension: verbalized understanding and returned demonstration   HOME EXERCISE PROGRAM: Access Code: [email or web address] URL: [email or web address] Date: 10/24/2021 Prepared by: Shelby Dubin Nayeli Calvert  Exercises - Seated Table Hamstring Brady  - 1 x daily - 7 x weekly - 1 sets - 3 reps - 20 sec hold - Seated Piriformis  Brady  - 1 x daily - 7 x weekly - 1 sets - 3 reps - 20 sec hold - Seated Pelvic Tilt  - 1 x daily - 7 x weekly - 2 sets - 10 reps - Seated March  - 1 x daily - 7 x weekly - 2 sets - 10 reps - Supine Bridge  - 1 x daily - 7 x weekly - 2 sets - 10 reps - Supine Pelvic Tilt  - 1 x daily - 7 x weekly - 2 sets - 10 reps - Supine March with Posterior Pelvic Tilt  - 1 x daily - 7 x weekly - 2 sets - 10 reps - Supine Lower Trunk Rotation  - 1 x daily - 7 x weekly - 1 sets - 10 reps - Sidelying Reverse Clamshell  - 1 x daily - 7 x weekly - 2 sets - 10 reps - Prone Knee Flexion  - 1 x daily - 7 x weekly - 2 sets - 10 reps - Prone Hip Extension  - 1 x daily - 7 x weekly - 2 sets - 10 reps - Side Stepping with Resistance at Thighs  - 1 x daily - 7 x weekly - 3 sets - 10 reps - Clamshell with Resistance  -  1 x daily - 7 x weekly - 2 sets - 10 reps - Bridge with Hip Abduction and Resistance  - 1 x daily - 7 x weekly - 2 sets - 10 reps - Supine March with Resistance Band  - 1 x daily - 7 x weekly - 2 sets - 10 reps  ASSESSMENT:  CLINICAL IMPRESSION: Patient presents to skilled PT with reports of compliance with HEP and states overall improvements.  Pt able to progress with strengthening and hip mobility exercises.  Requires min cuing throughout for pacing and technique.  Pt with some hip muscle weakness and utilized mirror to assist with visual cuing for hip alignment.  Pt continues to require skilled PT to progress towards goal related activities.   OBJECTIVE IMPAIRMENTS decreased balance, decreased strength, increased muscle spasms, impaired flexibility, and pain.   ACTIVITY LIMITATIONS bending, standing, and squatting  PARTICIPATION LIMITATIONS: community activity and occupation  PERSONAL FACTORS Time since onset of injury/illness/exacerbation and 1 comorbidity: Hepatitis B  are also affecting patient's functional outcome.   REHAB POTENTIAL: Good  CLINICAL DECISION MAKING:  Stable/uncomplicated  EVALUATION COMPLEXITY: Low   GOALS: Goals reviewed with patient? Yes  SHORT TERM GOALS: Target date: 10/18/2021  Pt will be independent with initial HEP. Baseline: Goal status: MET   LONG TERM GOALS: Target date: 11/17/2021   Pt will be independent with advanced HEP. Baseline:  Goal status: IN PROGRESS  2.  Pt will increase bilateral hip and hamstring strength to at least 5-/5 to allow her to return to the gym. Baseline:  Goal status: INITIAL  3.  Pt will report the ability to return to work with pain no greater than 3/10. Baseline:  Goal status: INITIAL  4.  Pt will have LEFS score of at least 60% to indicate improvements in functional mobility. Baseline:  Goal status: INITIAL    PLAN: PT FREQUENCY: 1-2x/week  PT DURATION: 8 weeks  PLANNED INTERVENTIONS: Therapeutic exercises, Therapeutic activity, Neuromuscular re-education, Balance training, Gait training, Patient/Family education, Self Care, Joint mobilization, Joint manipulation, Stair training, Aquatic Therapy, Dry Needling, Electrical stimulation, Spinal manipulation, Spinal mobilization, Cryotherapy, Moist heat, Taping, Ultrasound, Ionotophoresis 45m/ml Dexamethasone, Manual therapy, and Re-evaluation  PLAN FOR NEXT SESSION: assess and progress HEP as indicated, strengthening, flexibility   SJuel Burrow PT 10/24/2021, 11:56 AM   B[facility] SBynumGHackberry [place] [address]Phone # [phone] [phone]

## 2021-10-25 ENCOUNTER — Ambulatory Visit: Payer: 59 | Admitting: Specialist

## 2021-10-25 ENCOUNTER — Encounter: Payer: Self-pay | Admitting: Specialist

## 2021-10-25 VITALS — BP 125/89 | HR 76 | Ht <= 58 in | Wt 138.0 lb

## 2021-10-25 DIAGNOSIS — S39013S Strain of muscle, fascia and tendon of pelvis, sequela: Secondary | ICD-10-CM | POA: Diagnosis not present

## 2021-10-25 DIAGNOSIS — M222X2 Patellofemoral disorders, left knee: Secondary | ICD-10-CM

## 2021-10-25 DIAGNOSIS — S338XXS Sprain of other parts of lumbar spine and pelvis, sequela: Secondary | ICD-10-CM

## 2021-10-25 DIAGNOSIS — M222X1 Patellofemoral disorders, right knee: Secondary | ICD-10-CM | POA: Diagnosis not present

## 2021-10-25 MED ORDER — DICLOFENAC SODIUM 1 % EX GEL: 4.0000 g | Freq: Four times a day (QID) | CUTANEOUS | 4 refills | Status: DC

## 2021-10-25 NOTE — Progress Notes (Signed)
Office Visit Note   Patient: Brenda Brady           Date of Birth: 26-Jul-1975           MRN: [id number] Visit Date: 10/25/2021              Requested by: [name], MD [place],  [place] [id number] PCP: [name], MD   Assessment & Plan:  Visit Diagnoses:  1. Sacrotuberous (ligament) sprain, sequela   2. Strain of muscle, fascia and tendon of pelvis, sequela   3. Patellofemoral pain syndrome of both knees     Plan: right pelvis is suffering from ligament and muscle injury. YTreatments are Weight loss, NSIADs like naprosyn and  stretching exercise. Well padded shoes help. Ice the right pelvis 2-3 times a day 15-20 mins at a time.3 times a day 15-20 mins at a time. Hot showers in the AM.  Injection with steroid may be of benefit. Hemp CBD capsules, amazon.com 5,000-7,000 mg per bottle, 60 capsules per bottle, take one capsule twice a day. Cane in the left hand to use with right leg weight bearing. Continue Physical therapy for eval and treatment of the right pelvis contusion and sprain Voltaren gel for muscular pain and pain due to scar tissue locally up to 4 times a day.   Follow-Up Instructions: No follow-ups on file.   Orders:  No orders of the defined types were placed in this encounter.  No orders of the defined types were placed in this encounter.     Procedures: No procedures performed   Clinical Data: No additional findings.   Subjective: Chief Complaint  Patient presents with   Lower Back - Follow-up    46 year old female with history of fall with lower transverse sacral fracture that was nondisplaced. Injury identified 05/2021 and would be Expected to be healed. She has had some presistent pain in the buttocks and sacrococcygeal area. Seen last month and  PT initiated to Stretch sacrotuberous ligaments and mobilize the muscles and pelvic ligaments. She relates that therapy is helping. Wishes to have a Local lotion for muscle aches and pains.  Complains of knee pain with squatting and deep knee bends. No bowel or bladder difficulty. Taking meds for pain Has some constipation.     Review of Systems  Constitutional: Negative.   HENT: Negative.    Eyes: Negative.   Respiratory: Negative.    Cardiovascular: Negative.   Gastrointestinal: Negative.   Endocrine: Negative.   Genitourinary: Negative.   Musculoskeletal: Negative.   Skin: Negative.   Allergic/Immunologic: Negative.   Neurological: Negative.   Hematological: Negative.   Psychiatric/Behavioral: Negative.       Objective: Vital Signs: BP 125/89 (BP Location: Left Arm, Patient Position: Sitting)   Pulse 76   Ht '4\' 9"'$  (1.448 m)   Wt 138 lb (62.6 kg)   BMI 29.86 kg/m   Physical Exam Constitutional:      Appearance: She is well-developed.  HENT:     Head: Normocephalic and atraumatic.  Eyes:     Pupils: Pupils are equal, round, and reactive to light.  Pulmonary:     Effort: Pulmonary effort is normal.     Breath sounds: Normal breath sounds.  Abdominal:     General: Bowel sounds are normal.     Palpations: Abdomen is soft.  Musculoskeletal:     Cervical back: Normal range of motion and neck supple.     Lumbar back: Negative right straight leg  raise test and negative left straight leg raise test.  Skin:    General: Skin is warm and dry.  Neurological:     Mental Status: She is alert and oriented to person, place, and time.  Psychiatric:        Behavior: Behavior normal.        Thought Content: Thought content normal.        Judgment: Judgment normal.     Back Exam   Tenderness  The patient is experiencing tenderness in the lumbar and sacroiliac.  Range of Motion  Extension:  abnormal  Flexion:  abnormal  Lateral bend right:  abnormal  Lateral bend left:  abnormal  Rotation right:  abnormal  Rotation left:  abnormal   Muscle Strength  Right Quadriceps:  5/5  Left Quadriceps:  5/5  Right Hamstrings:  5/5  Left Hamstrings:  5/5    Tests  Straight leg raise right: negative Straight leg raise left: negative  Reflexes  Achilles:  2/4      Specialty Comments:  No specialty comments available.  Imaging: No results found.   PMFS History: Patient Active Problem List   Diagnosis Date Noted   Coccydynia 06/02/2021   Lytic lesion of bone on x-ray 06/02/2021   Encounter for annual physical exam 12/24/2020   Microcytic anemia 12/24/2020   Elevated BP without diagnosis of hypertension 12/24/2020   Allergic rhinitis 12/24/2020   COVID-19 12/02/2019   Chronic active viral hepatitis B ([place]) 11/05/2012   OVERWEIGHT 02/07/2009   Past Medical History:  Diagnosis Date   BMI 29.0-29.9,adult    Hepatitis B infection    Hepatitis B, chronic ([facility])    Hepatitis B, chronic ([place])     History reviewed. No pertinent family history.  Past Surgical History:  Procedure Laterality Date   VAGINAL DELIVERY     Social History   Occupational History   Not on file  Tobacco Use   Smoking status: Never   Smokeless tobacco: Never  Substance and Sexual Activity   Alcohol use: No   Drug use: No   Sexual activity: Yes    Birth control/protection: I.U.D.

## 2021-10-25 NOTE — Patient Instructions (Signed)
Plan: right pelvis is suffering from ligament and muscle injury. YTreatments are Weight loss, NSIADs like naprosyn and  stretching exercise. Well padded shoes help. Ice the right pelvis 2-3 times a day 15-20 mins at a time.3 times a day 15-20 mins at a time. Hot showers in the AM.  Injection with steroid may be of benefit. Hemp CBD capsules, amazon.com 5,000-7,000 mg per bottle, 60 capsules per bottle, take one capsule twice a day. Cane in the left hand to use with right leg weight bearing. Continue Physical therapy for eval and treatment of the right pelvis contusion and sprain Voltaren gel for muscular pain and pain due to scar tissue locally up to 4 times a day.

## 2021-10-31 ENCOUNTER — Encounter: Payer: Self-pay | Admitting: Rehabilitative and Restorative Service Providers"

## 2021-10-31 ENCOUNTER — Ambulatory Visit: Payer: 59 | Admitting: Rehabilitative and Restorative Service Providers"

## 2021-10-31 DIAGNOSIS — M6281 Muscle weakness (generalized): Secondary | ICD-10-CM | POA: Diagnosis not present

## 2021-10-31 DIAGNOSIS — M533 Sacrococcygeal disorders, not elsewhere classified: Secondary | ICD-10-CM

## 2021-10-31 NOTE — Therapy (Signed)
OUTPATIENT PHYSICAL THERAPY TREATMENT NOTE   Patient Name: Brenda Brady MRN: [id number] DOB:06-20-1975, 46 y.o., female Today's Date: 10/31/2021   PT End of Session - 10/31/21 1146     Visit Number 5    Date for PT Re-Evaluation 11/17/21    Authorization Type [employer]    PT Start Time 1145    PT Stop Time 1225    PT Time Calculation (min) 40 min    Activity Tolerance Patient tolerated treatment well    Behavior During Therapy WFL for tasks assessed/performed             Past Medical History:  Diagnosis Date   BMI 29.0-29.9,adult    Hepatitis B infection    Hepatitis B, chronic ([facility])    Hepatitis B, chronic ([place])    Past Surgical History:  Procedure Laterality Date   VAGINAL DELIVERY     Patient Active Problem List   Diagnosis Date Noted   Coccydynia 06/02/2021   Lytic lesion of bone on x-ray 06/02/2021   Encounter for annual physical exam 12/24/2020   Microcytic anemia 12/24/2020   Elevated BP without diagnosis of hypertension 12/24/2020   Allergic rhinitis 12/24/2020   COVID-19 12/02/2019   Chronic active viral hepatitis B ([place]) 11/05/2012   OVERWEIGHT 02/07/2009    PCP: [name], MD  REFERRING PROVIDER: Jessy Oto, MD   REFERRING DIAG: S33.8XXS (ICD-10-CM) - Sacrotuberous (ligament) sprain, sequela S39.013S (ICD-10-CM) - Strain of muscle, fascia and tendon of pelvis, sequela   THERAPY DIAG:  Muscle weakness (generalized)  Sacral pain  Rationale for Evaluation and Treatment Rehabilitation  ONSET DATE: 05/10/2021  SUBJECTIVE:   SUBJECTIVE STATEMENT: Pt reports that she has been doing her new exercises, states that she has been able to return to work some, but does have pain and rests in between clients. Pt with interpreter present during session  PERTINENT HISTORY: Hepatitis B  PAIN:  Are you having pain? Yes: NPRS scale: 0-2/10 Pain location: sacral region into hips Pain description: sore, aching Aggravating factors: sleeping on her  side Relieving factors: movement  PRECAUTIONS: None  WEIGHT BEARING RESTRICTIONS No  FALLS:  Has patient fallen in last 6 months? Yes. Number of falls 1 fall at the gym  LIVING ENVIRONMENT: Lives with: lives with their spouse Lives in: House/apartment Has following equipment at home: None  OCCUPATION: Nail Tech, currently out due to pain  PLOF: Independent  PATIENT GOALS:  To be able to return to work without pain.   OBJECTIVE:   DIAGNOSTIC FINDINGS:  Sacral radiograph on 08/14/2021 Progressive healing callus seen at distal sacral fracture in unchanged alignment.  PATIENT SURVEYS:  LEFS  37 / 80 = 46.3 %  COGNITION:  Overall cognitive status: Within functional limits for tasks assessed     SENSATION: WFL  MUSCLE LENGTH: Hamstrings: tightness bilat with pain  POSTURE: No Significant postural limitations  PALPATION: Tender to palpation over sacral region  LOWER EXTREMITY ROM: WFL  LOWER EXTREMITY MMT: B hip strength of 4/5, B hamstring strength 4/5, B quad strength 5/5  FUNCTIONAL TESTS:  5 times sit to stand: 12.99 sec with limited UE use on thighs  GAIT: Distance walked: >500 ft Assistive device utilized: None Level of assistance: Complete Independence Comments: Antalgic gait noted, with some imbalance    TODAY'S TREATMENT: 10/31/2021: Recumbent bike level 3 x6 min with PT present to discuss status Hooklying with red loop around knees performing marching (cuing for decreased pacing) 2x10 Hooklying clamshells with red loop 2x10 Supine bridges  with red loop around knees 2x10 Hooklying with alt knee and hand press together with red pball for TA contraction x10 Dying bug with red pball x10 Prone alt UE/LE extension 2x10 bilat Quadruped fire hydrant 2x10 bilat Quadruped donkey kicks 2x10 bilat Standing with 8# dumbbell in goblet position performing high marching x10 Standing with 8# dumbbell in goblet position performing high march with trunk  rotation 2x10 Mini lunges fwd and backwards 2x10 bilat Side lunges with one foot on slider 2x10 bilat   10/24/2021: Lower trunk rotation x10 Hooklying with red loop around knees performing marching (cuing for decreased pacing) 2x10 Hooklying clamshells with red loop 2x10 Supine bridges with red loop around knees 2x10 Supine bridging with hip abduction clamshell with red loop 2x10 Hooklying with alt knee and hand press together with red pball for TA contraction x10 Dying bug with red pball x10 Sit to stand holding 4# weight in each hand x10 with cuing to control eccentric contraction Sit to stand holding 4# weight with overhead press x10 Standing with 8# dumbbell in goblet position performing high marching 2x10 Standing with 8# dumbbell in goblet position performing high march with trunk rotation x10 Standing hip hiking on 2" step 2x15 bilat Side stepping with red loop around thighs 3x10 ft Mini lunges fwd and backwards 3x10 ft  Side lunges and backward lunges with one foot on slider x10 each bilat with cuing for glute activation Mini squats standing on blue foam 2x10 with cuing for technique   10/19/2021: Recumbent bike level 2 x6 min with PT present to discuss status Seated piriformis stretch 2x20 sec bilat Seated table hamstring stretch 2x20 sec bilat 4 way pelvic tilt seated on dynadisc 2x10 bilat Supine IT band stretch then hip adductor stretch 2x20 sec bilat each Supine bridges with blue loop around knees 2x10 Sidelying clamshells with blue loop and reverse clamshells 2x10 bilat Prone hip extension 2x10 bilat Farmer's carry with 15# kettlebell in unilateral hand 3x10 ft bilat Lunge walking 4x10 ft    PATIENT EDUCATION:  Education details: Issued HEP Person educated: Patient Education method: Explanation, Media planner, and Handouts Education comprehension: verbalized understanding and returned demonstration   HOME EXERCISE PROGRAM: Access Code: [email or web address] URL:  [email or web address] Date: 10/24/2021 Prepared by: Shelby Dubin Fleet [place]am  Exercises - Seated Table Hamstring Stretch  - 1 x daily - 7 x weekly - 1 sets - 3 reps - 20 sec hold - Seated Piriformis Stretch  - 1 x daily - 7 x weekly - 1 sets - 3 reps - 20 sec hold - Seated Pelvic Tilt  - 1 x daily - 7 x weekly - 2 sets - 10 reps - Seated March  - 1 x daily - 7 x weekly - 2 sets - 10 reps - Supine Bridge  - 1 x daily - 7 x weekly - 2 sets - 10 reps - Supine Pelvic Tilt  - 1 x daily - 7 x weekly - 2 sets - 10 reps - Supine March with Posterior Pelvic Tilt  - 1 x daily - 7 x weekly - 2 sets - 10 reps - Supine Lower Trunk Rotation  - 1 x daily - 7 x weekly - 1 sets - 10 reps - Sidelying Reverse Clamshell  - 1 x daily - 7 x weekly - 2 sets - 10 reps - Prone Knee Flexion  - 1 x daily - 7 x weekly - 2 sets - 10 reps - Prone Hip Extension  - 1 x daily - 7 x  weekly - 2 sets - 10 reps - Side Stepping with Resistance at Thighs  - 1 x daily - 7 x weekly - 3 sets - 10 reps - Clamshell with Resistance  - 1 x daily - 7 x weekly - 2 sets - 10 reps - Bridge with Hip Abduction and Resistance  - 1 x daily - 7 x weekly - 2 sets - 10 reps - Supine March with Resistance Band  - 1 x daily - 7 x weekly - 2 sets - 10 reps  ASSESSMENT:  CLINICAL IMPRESSION: Ms [place] presents to skilled PT with continued progress towards goals and tolerated last session well.  Pt was able to continue to build on exercises and strengthening.  She states that she has been able to start working some, but does have increased pain with work.  Pt continues to require skilled PT to progress towards goal related activities and decreased pain with working.   OBJECTIVE IMPAIRMENTS decreased balance, decreased strength, increased muscle spasms, impaired flexibility, and pain.   ACTIVITY LIMITATIONS bending, standing, and squatting  PARTICIPATION LIMITATIONS: community activity and occupation  PERSONAL FACTORS Time since onset of  injury/illness/exacerbation and 1 comorbidity: Hepatitis B  are also affecting patient's functional outcome.   REHAB POTENTIAL: Good  CLINICAL DECISION MAKING: Stable/uncomplicated  EVALUATION COMPLEXITY: Low   GOALS: Goals reviewed with patient? Yes  SHORT TERM GOALS: Target date: 10/18/2021  Pt will be independent with initial HEP. Baseline: Goal status: MET   LONG TERM GOALS: Target date: 11/17/2021   Pt will be independent with advanced HEP. Baseline:  Goal status: IN PROGRESS  2.  Pt will increase bilateral hip and hamstring strength to at least 5-/5 to allow her to return to the gym. Baseline:  Goal status: INITIAL  3.  Pt will report the ability to return to work with pain no greater than 3/10. Baseline:  Goal status: INITIAL  4.  Pt will have LEFS score of at least 60% to indicate improvements in functional mobility. Baseline:  Goal status: INITIAL    PLAN: PT FREQUENCY: 1-2x/week  PT DURATION: 8 weeks  PLANNED INTERVENTIONS: Therapeutic exercises, Therapeutic activity, Neuromuscular re-education, Balance training, Gait training, Patient/Family education, Self Care, Joint mobilization, Joint manipulation, Stair training, Aquatic Therapy, Dry Needling, Electrical stimulation, Spinal manipulation, Spinal mobilization, Cryotherapy, Moist heat, Taping, Ultrasound, Ionotophoresis 41m/ml Dexamethasone, Manual therapy, and Re-evaluation  PLAN FOR NEXT SESSION: assess and progress HEP as indicated, strengthening, flexibility   SJuel Burrow PT 10/31/2021, 12:55 PM   [facility] SBrasher Falls[place]West [place] [id number] # [phone] [phone]

## 2021-11-07 ENCOUNTER — Encounter: Payer: Self-pay | Admitting: Rehabilitative and Restorative Service Providers"

## 2021-11-07 ENCOUNTER — Ambulatory Visit: Payer: 59 | Attending: Specialist | Admitting: Rehabilitative and Restorative Service Providers"

## 2021-11-07 DIAGNOSIS — M6281 Muscle weakness (generalized): Secondary | ICD-10-CM | POA: Diagnosis not present

## 2021-11-07 DIAGNOSIS — M533 Sacrococcygeal disorders, not elsewhere classified: Secondary | ICD-10-CM | POA: Insufficient documentation

## 2021-11-07 NOTE — Therapy (Signed)
OUTPATIENT PHYSICAL THERAPY TREATMENT NOTE   Patient Name: Brenda Brady MRN: [id number] DOB:03/19/1975, 46 y.o., female Today's Date: 11/07/2021   PT End of Session - 11/07/21 1145     Visit Number 6    Date for PT Re-Evaluation 11/17/21    Authorization Type [employer]    PT Start Time 1140    PT Stop Time 1220    PT Time Calculation (min) 40 min    Activity Tolerance Patient tolerated treatment well    Behavior During Therapy WFL for tasks assessed/performed             Past Medical History:  Diagnosis Date   BMI 29.0-29.9,adult    Hepatitis B infection    Hepatitis B, chronic ([place])    Hepatitis B, chronic ([place])    Past Surgical History:  Procedure Laterality Date   VAGINAL DELIVERY     Patient Active Problem List   Diagnosis Date Noted   Coccydynia 06/02/2021   Lytic lesion of bone on x-ray 06/02/2021   Encounter for annual physical exam 12/24/2020   Microcytic anemia 12/24/2020   Elevated BP without diagnosis of hypertension 12/24/2020   Allergic rhinitis 12/24/2020   COVID-19 12/02/2019   Chronic active viral hepatitis B ([place]) 11/05/2012   OVERWEIGHT 02/07/2009    PCP: [name], MD  REFERRING PROVIDER: Jessy Oto, MD   REFERRING DIAG: S33.8XXS (ICD-10-CM) - Sacrotuberous (ligament) sprain, sequela S39.013S (ICD-10-CM) - Strain of muscle, fascia and tendon of pelvis, sequela   THERAPY DIAG:  Muscle weakness (generalized)  Sacral pain  Rationale for Evaluation and Treatment Rehabilitation  ONSET DATE: 05/10/2021  SUBJECTIVE:   SUBJECTIVE STATEMENT: Pt reports that she has been doing her new exercises, states that she has been able to return to work some, but does have pain and rests in between clients. Pt with interpreter present during session  PERTINENT HISTORY: Hepatitis B  PAIN:  Are you having pain? Yes: NPRS scale: 0-2/10 Pain location: sacral region into hips Pain description: sore, aching Aggravating factors: sleeping on her  side Relieving factors: movement  PRECAUTIONS: None  WEIGHT BEARING RESTRICTIONS No  FALLS:  Has patient fallen in last 6 months? Yes. Number of falls 1 fall at the gym  LIVING ENVIRONMENT: Lives with: lives with their spouse Lives in: House/apartment Has following equipment at home: None  OCCUPATION: Nail Tech, currently out due to pain  PLOF: Independent  PATIENT GOALS:  To be able to return to work without pain.   OBJECTIVE:   DIAGNOSTIC FINDINGS:  Sacral radiograph on 08/14/2021 Progressive healing callus seen at distal sacral fracture in unchanged alignment.  PATIENT SURVEYS:  Eval:  LEFS  37 / 80 = 46.3 %  11/07/2021:  Lower Extremity Functional Score: 62 / 80 = 77.5 %  COGNITION:  Overall cognitive status: Within functional limits for tasks assessed     SENSATION: WFL  MUSCLE LENGTH: Hamstrings: tightness bilat with pain  POSTURE: No Significant postural limitations  PALPATION: Tender to palpation over sacral region  LOWER EXTREMITY ROM: WFL  LOWER EXTREMITY MMT: B hip strength of 4/5, B hamstring strength 4/5, B quad strength 5/5  FUNCTIONAL TESTS:  Eval:  5 times sit to stand: 12.99 sec with limited UE use on thighs  GAIT: Distance walked: >500 ft Assistive device utilized: None Level of assistance: Complete Independence Comments: Antalgic gait noted, with some imbalance    TODAY'S TREATMENT: 11/07/2021: Recumbent bike level 4 x6 min with PT present to discuss status Lower Extremity Functional Score:  62 / 80 = 77.5 % Standing with 8# dumbbell in goblet position performing high marching x10 Standing with 8# dumbbell in goblet position performing high march with trunk rotation 2x10 Single leg stance performing cone taps with UE 2x10 bilat Quadruped fire hydrant 2x10 bilat Quadruped donkey kicks 2x10 bilat Supine bridging with hip abduction clamshell with red loop 2x10 Hooklying with red loop around knees performing marching (cuing for  decreased pacing) 2x10 Dying bug with red pball x10 Lower trunk rotation with BLE on red pball 2x10 Prone alt UE/LE extension 2x10 bilat FWD and backwards lunge walking 4x10 ft each Hip Matrix 40# for hip abduction and extension 2x10 each bilat   10/31/2021: Recumbent bike level 3 x6 min with PT present to discuss status Hooklying with red loop around knees performing marching (cuing for decreased pacing) 2x10 Hooklying clamshells with red loop 2x10 Supine bridges with red loop around knees 2x10 Hooklying with alt knee and hand press together with red pball for TA contraction x10 Dying bug with red pball x10 Prone alt UE/LE extension 2x10 bilat Quadruped fire hydrant 2x10 bilat Quadruped donkey kicks 2x10 bilat Standing with 8# dumbbell in goblet position performing high marching x10 Standing with 8# dumbbell in goblet position performing high march with trunk rotation 2x10 Mini lunges fwd and backwards 2x10 bilat Side lunges with one foot on slider 2x10 bilat   10/24/2021: Lower trunk rotation x10 Hooklying with red loop around knees performing marching (cuing for decreased pacing) 2x10 Hooklying clamshells with red loop 2x10 Supine bridges with red loop around knees 2x10 Supine bridging with hip abduction clamshell with red loop 2x10 Hooklying with alt knee and hand press together with red pball for TA contraction x10 Dying bug with red pball x10 Sit to stand holding 4# weight in each hand x10 with cuing to control eccentric contraction Sit to stand holding 4# weight with overhead press x10 Standing with 8# dumbbell in goblet position performing high marching 2x10 Standing with 8# dumbbell in goblet position performing high march with trunk rotation x10 Standing hip hiking on 2" step 2x15 bilat Side stepping with red loop around thighs 3x10 ft Mini lunges fwd and backwards 3x10 ft  Side lunges and backward lunges with one foot on slider x10 each bilat with cuing for glute  activation Mini squats standing on blue foam 2x10 with cuing for technique     PATIENT EDUCATION:  Education details: Issued HEP Person educated: Patient Education method: Explanation, Demonstration, and Handouts Education comprehension: verbalized understanding and returned demonstration   HOME EXERCISE PROGRAM: Access Code: [email or web address] URL: [email or web address] Date: 10/24/2021 Prepared by: Shelby Dubin Lashara Urey  Exercises - Seated Table Hamstring Stretch  - 1 x daily - 7 x weekly - 1 sets - 3 reps - 20 sec hold - Seated Piriformis Stretch  - 1 x daily - 7 x weekly - 1 sets - 3 reps - 20 sec hold - Seated Pelvic Tilt  - 1 x daily - 7 x weekly - 2 sets - 10 reps - Seated March  - 1 x daily - 7 x weekly - 2 sets - 10 reps - Supine Bridge  - 1 x daily - 7 x weekly - 2 sets - 10 reps - Supine Pelvic Tilt  - 1 x daily - 7 x weekly - 2 sets - 10 reps - Supine March with Posterior Pelvic Tilt  - 1 x daily - 7 x weekly - 2 sets - 10 reps - Supine Lower Trunk Rotation  -  1 x daily - 7 x weekly - 1 sets - 10 reps - Sidelying Reverse Clamshell  - 1 x daily - 7 x weekly - 2 sets - 10 reps - Prone Knee Flexion  - 1 x daily - 7 x weekly - 2 sets - 10 reps - Prone Hip Extension  - 1 x daily - 7 x weekly - 2 sets - 10 reps - Side Stepping with Resistance at Thighs  - 1 x daily - 7 x weekly - 3 sets - 10 reps - Clamshell with Resistance  - 1 x daily - 7 x weekly - 2 sets - 10 reps - Bridge with Hip Abduction and Resistance  - 1 x daily - 7 x weekly - 2 sets - 10 reps - Supine March with Resistance Band  - 1 x daily - 7 x weekly - 2 sets - 10 reps  ASSESSMENT:  CLINICAL IMPRESSION: Ms Hanssen presents to skilled PT with continued progress towards goals and reports that she has been able to work some.  Reports that at times, she gets sore and has to lie down to decrease her soreness.  Pt able to progress with strengthening and able to add in Hip Matrix machine today.  Pt is progressing towards  goals, as anticipated, and pt on track to discharge next visit to continue with HEP.   OBJECTIVE IMPAIRMENTS decreased balance, decreased strength, increased muscle spasms, impaired flexibility, and pain.   ACTIVITY LIMITATIONS bending, standing, and squatting  PARTICIPATION LIMITATIONS: community activity and occupation  PERSONAL FACTORS Time since onset of injury/illness/exacerbation and 1 comorbidity: Hepatitis B  are also affecting patient's functional outcome.   REHAB POTENTIAL: Good  CLINICAL DECISION MAKING: Stable/uncomplicated  EVALUATION COMPLEXITY: Low   GOALS: Goals reviewed with patient? Yes  SHORT TERM GOALS: Target date: 10/18/2021  Pt will be independent with initial HEP. Baseline: Goal status: MET   LONG TERM GOALS: Target date: 11/17/2021   Pt will be independent with advanced HEP. Baseline:  Goal status: MET  2.  Pt will increase bilateral hip and hamstring strength to at least 5-/5 to allow her to return to the gym. Baseline:  Goal status: IN PROGRESS  3.  Pt will report the ability to return to work with pain no greater than 3/10. Baseline:  Goal status: IN PROGRESS  4.  Pt will have LEFS score of at least 60% to indicate improvements in functional mobility. Baseline: 46.3% Goal status: MET on 11/07/2021    PLAN: PT FREQUENCY: 1-2x/week  PT DURATION: 8 weeks  PLANNED INTERVENTIONS: Therapeutic exercises, Therapeutic activity, Neuromuscular re-education, Balance training, Gait training, Patient/Family education, Self Care, Joint mobilization, Joint manipulation, Stair training, Aquatic Therapy, Dry Needling, Electrical stimulation, Spinal manipulation, Spinal mobilization, Cryotherapy, Moist heat, Taping, Ultrasound, Ionotophoresis 71m/ml Dexamethasone, Manual therapy, and Re-evaluation  PLAN FOR NEXT SESSION: Discharge visit   Rutledge Selsor, PT 11/07/2021, 12:23 PM   [place] [address] [place] [address]Phone # [phone] [phone]

## 2021-11-14 ENCOUNTER — Encounter: Payer: Self-pay | Admitting: Rehabilitative and Restorative Service Providers"

## 2021-11-14 ENCOUNTER — Ambulatory Visit: Payer: 59 | Admitting: Rehabilitative and Restorative Service Providers"

## 2021-11-14 DIAGNOSIS — M533 Sacrococcygeal disorders, not elsewhere classified: Secondary | ICD-10-CM | POA: Diagnosis not present

## 2021-11-14 DIAGNOSIS — M6281 Muscle weakness (generalized): Secondary | ICD-10-CM

## 2021-11-14 NOTE — Therapy (Signed)
OUTPATIENT PHYSICAL THERAPY TREATMENT NOTE AND DISCHARGE SUMMARY   Patient Name: Brenda Brady MRN: [id number] DOB:1975-06-29, 46 y.o., female Today's Date: 11/14/2021   PT End of Session - 11/14/21 1119     Visit Number 7    Date for PT Re-Evaluation 11/17/21    Authorization Type [employer]    PT Start Time 1104    PT Stop Time 1142    PT Time Calculation (min) 38 min    Activity Tolerance Patient tolerated treatment well    Behavior During Therapy WFL for tasks assessed/performed             Past Medical History:  Diagnosis Date   BMI 29.0-29.9,adult    Hepatitis B infection    Hepatitis B, chronic ([place])    Hepatitis B, chronic ([place])    Past Surgical History:  Procedure Laterality Date   VAGINAL DELIVERY     Patient Active Problem List   Diagnosis Date Noted   Coccydynia 06/02/2021   Lytic lesion of bone on x-ray 06/02/2021   Encounter for annual physical exam 12/24/2020   Microcytic anemia 12/24/2020   Elevated BP without diagnosis of hypertension 12/24/2020   Allergic rhinitis 12/24/2020   COVID-19 12/02/2019   Chronic active viral hepatitis B ([place]) 11/05/2012   OVERWEIGHT 02/07/2009    PCP: [name], MD  REFERRING PROVIDER: Jessy Oto, MD   REFERRING DIAG: S33.8XXS (ICD-10-CM) - Sacrotuberous (ligament) sprain, sequela S39.013S (ICD-10-CM) - Strain of muscle, fascia and tendon of pelvis, sequela   THERAPY DIAG:  Muscle weakness (generalized)  Sacral pain  Rationale for Evaluation and Treatment Rehabilitation  ONSET DATE: 05/10/2021  SUBJECTIVE:   SUBJECTIVE STATEMENT: Pt reports that she has been able to work without pain.  States some lumbar pain with increased standing with cooking in the morning.  PERTINENT HISTORY: Hepatitis B  PAIN:  Are you having pain? Yes: NPRS scale: 0-2/10 Pain location: sacral region into hips Pain description: sore, aching Aggravating factors: sleeping on her side Relieving factors: movement  PRECAUTIONS:  None  WEIGHT BEARING RESTRICTIONS No  FALLS:  Has patient fallen in last 6 months? Yes. Number of falls 1 fall at the gym  LIVING ENVIRONMENT: Lives with: lives with their spouse Lives in: House/apartment Has following equipment at home: None  OCCUPATION: Nail Tech, currently out due to pain  PLOF: Independent  PATIENT GOALS:  To be able to return to work without pain.   OBJECTIVE:   DIAGNOSTIC FINDINGS:  Sacral radiograph on 08/14/2021 Progressive healing callus seen at distal sacral fracture in unchanged alignment.  PATIENT SURVEYS:  Eval:  LEFS  37 / 80 = 46.3 %  11/07/2021:  Lower Extremity Functional Score: 62 / 80 = 77.5 %  COGNITION:  Overall cognitive status: Within functional limits for tasks assessed     SENSATION: WFL  MUSCLE LENGTH: Hamstrings: tightness bilat with pain  POSTURE: No Significant postural limitations  PALPATION: Tender to palpation over sacral region  LOWER EXTREMITY ROM: WFL  LOWER EXTREMITY MMT: B hip strength of 4/5, B hamstring strength 4/5, B quad strength 5/5  FUNCTIONAL TESTS:  Eval:  5 times sit to stand: 12.99 sec with limited UE use on thighs 11/14/2021:  5 times sit to stand:  7.4 sec without UE use  GAIT: Distance walked: >500 ft Assistive device utilized: None Level of assistance: Complete Independence Comments: Antalgic gait noted, with some imbalance    TODAY'S TREATMENT: 11/14/2021: Recumbent bike level 4 x6 min with PT present to discuss status  Seated 3 way green pball rollout 5x10 sec 4 way pelvic tilts seated on dynadisc x20 each Sit to/from stand x5 Dying bug with red pball 2x10 Prone alt UE/LE extension 2x10 bilat Quadruped cat/cow 2x10 Quadruped Donkey Kick 2x10 Quadruped alt UE/LE extension 2x10 bilat   11/07/2021: Recumbent bike level 4 x6 min with PT present to discuss status Lower Extremity Functional Score: 62 / 80 = 77.5 % Standing with 8# dumbbell in goblet position performing high  marching x10 Standing with 8# dumbbell in goblet position performing high march with trunk rotation 2x10 Single leg stance performing cone taps with UE 2x10 bilat Quadruped fire hydrant 2x10 bilat Quadruped donkey kicks 2x10 bilat Supine bridging with hip abduction clamshell with red loop 2x10 Hooklying with red loop around knees performing marching (cuing for decreased pacing) 2x10 Dying bug with red pball x10 Lower trunk rotation with BLE on red pball 2x10 Prone alt UE/LE extension 2x10 bilat FWD and backwards lunge walking 4x10 ft each Hip Matrix 40# for hip abduction and extension 2x10 each bilat   10/31/2021: Recumbent bike level 3 x6 min with PT present to discuss status Hooklying with red loop around knees performing marching (cuing for decreased pacing) 2x10 Hooklying clamshells with red loop 2x10 Supine bridges with red loop around knees 2x10 Hooklying with alt knee and hand press together with red pball for TA contraction x10 Dying bug with red pball x10 Prone alt UE/LE extension 2x10 bilat Quadruped fire hydrant 2x10 bilat Quadruped donkey kicks 2x10 bilat Standing with 8# dumbbell in goblet position performing high marching x10 Standing with 8# dumbbell in goblet position performing high march with trunk rotation 2x10 Mini lunges fwd and backwards 2x10 bilat Side lunges with one foot on slider 2x10 bilat   10/24/2021: Lower trunk rotation x10 Hooklying with red loop around knees performing marching (cuing for decreased pacing) 2x10 Hooklying clamshells with red loop 2x10 Supine bridges with red loop around knees 2x10 Supine bridging with hip abduction clamshell with red loop 2x10 Hooklying with alt knee and hand press together with red pball for TA contraction x10 Dying bug with red pball x10 Sit to stand holding 4# weight in each hand x10 with cuing to control eccentric contraction Sit to stand holding 4# weight with overhead press x10 Standing with 8# dumbbell in  goblet position performing high marching 2x10 Standing with 8# dumbbell in goblet position performing high march with trunk rotation x10 Standing hip hiking on 2" step 2x15 bilat Side stepping with red loop around thighs 3x10 ft Mini lunges fwd and backwards 3x10 ft  Side lunges and backward lunges with one foot on slider x10 each bilat with cuing for glute activation Mini squats standing on blue foam 2x10 with cuing for technique     PATIENT EDUCATION:  Education details: Issued HEP Person educated: Patient Education method: Explanation, Demonstration, and Handouts Education comprehension: verbalized understanding and returned demonstration   HOME EXERCISE PROGRAM: Access Code: [email or web address] URL: [email or web address] Date: 11/14/2021 Prepared by: Shelby Dubin Ulrich Soules  Exercises - Seated Table Hamstring Stretch  - 1 x daily - 7 x weekly - 1 sets - 3 reps - 20 sec hold - Seated Piriformis Stretch  - 1 x daily - 7 x weekly - 1 sets - 3 reps - 20 sec hold - Seated Pelvic Tilt  - 1 x daily - 7 x weekly - 2 sets - 10 reps - Seated March  - 1 x daily - 7 x weekly - 2 sets - 10 reps -  TL Sidebending Stretch - Arms Overhead  - 1 x daily - 7 x weekly - 1 sets - 5 reps - 10 sec hold - Standing Lumbar Spine Flexion Stretch Counter  - 1 x daily - 7 x weekly - 1 sets - 3 reps - 20 sec hold - Supine Bridge  - 1 x daily - 7 x weekly - 2 sets - 10 reps - Supine Pelvic Tilt  - 1 x daily - 7 x weekly - 2 sets - 10 reps - Supine March with Posterior Pelvic Tilt  - 1 x daily - 7 x weekly - 2 sets - 10 reps - Supine Lower Trunk Rotation  - 1 x daily - 7 x weekly - 1 sets - 10 reps - Sidelying Reverse Clamshell  - 1 x daily - 7 x weekly - 2 sets - 10 reps - Prone Knee Flexion  - 1 x daily - 7 x weekly - 2 sets - 10 reps - Prone Hip Extension  - 1 x daily - 7 x weekly - 2 sets - 10 reps - Side Stepping with Resistance at Thighs  - 1 x daily - 7 x weekly - 3 sets - 10 reps - Clamshell with  Resistance  - 1 x daily - 7 x weekly - 2 sets - 10 reps - Bridge with Hip Abduction and Resistance  - 1 x daily - 7 x weekly - 2 sets - 10 reps - Supine March with Resistance Band  - 1 x daily - 7 x weekly - 2 sets - 10 reps - Cat Cow  - 1 x daily - 7 x weekly - 2 sets - 10 reps - Quadruped Fire Hydrant  - 1 x daily - 7 x weekly - 2 sets - 10 reps - Bird Dog  - 1 x daily - 7 x weekly - 2 sets - 10 reps  ASSESSMENT:  CLINICAL IMPRESSION: Ms Breit presents to skilled PT with continued progress with therapy and has met all goals.  Pt reports that she has continued to be able to work and sometimes has a little soreness, but no increased pain.  Provided pt with updated HEP and provided new print-out and texted the code so pt could perform at home on computer or phone.  Pt is ready to be discharged from skilled PT at this time to continue with HEP.   OBJECTIVE IMPAIRMENTS decreased balance, decreased strength, increased muscle spasms, impaired flexibility, and pain.   ACTIVITY LIMITATIONS bending, standing, and squatting  PARTICIPATION LIMITATIONS: community activity and occupation  PERSONAL FACTORS Time since onset of injury/illness/exacerbation and 1 comorbidity: Hepatitis B  are also affecting patient's functional outcome.   REHAB POTENTIAL: Good  CLINICAL DECISION MAKING: Stable/uncomplicated  EVALUATION COMPLEXITY: Low   GOALS: Goals reviewed with patient? Yes  SHORT TERM GOALS: Target date: 10/18/2021  Pt will be independent with initial HEP. Baseline: Goal status: MET   LONG TERM GOALS: Target date: 11/17/2021   Pt will be independent with advanced HEP. Baseline:  Goal status: MET  2.  Pt will increase bilateral hip and hamstring strength to at least 5-/5 to allow her to return to the gym. Baseline:  Goal status: IN PROGRESS  3.  Pt will report the ability to return to work with pain no greater than 3/10. Baseline:  Goal status: IN PROGRESS  4.  Pt will have LEFS  score of at least 60% to indicate improvements in functional mobility. Baseline: 46.3% Goal status:  MET on 11/07/2021    PLAN: PT FREQUENCY: 1-2x/week  PT DURATION: 8 weeks  PLANNED INTERVENTIONS: Therapeutic exercises, Therapeutic activity, Neuromuscular re-education, Balance training, Gait training, Patient/Family education, Self Care, Joint mobilization, Joint manipulation, Stair training, Aquatic Therapy, Dry Needling, Electrical stimulation, Spinal manipulation, Spinal mobilization, Cryotherapy, Moist heat, Taping, Ultrasound, Ionotophoresis 40m/ml Dexamethasone, Manual therapy, and Re-evaluation  PLAN FOR NEXT SESSION: Discharge visit on 11/14/2021    PHYSICAL THERAPY DISCHARGE SUMMARY   Patient agrees to discharge. Patient goals were met. Patient is being discharged due to meeting the stated rehab goals.     SJuel Burrow PT 11/14/2021, 11:47 AM   [facility] [place] [address]Phone # [phone] [phone]

## 2021-11-17 ENCOUNTER — Ambulatory Visit: Payer: 59 | Admitting: Rehabilitative and Restorative Service Providers"

## 2021-11-27 ENCOUNTER — Ambulatory Visit (INDEPENDENT_AMBULATORY_CARE_PROVIDER_SITE_OTHER): Payer: 59 | Admitting: Specialist

## 2021-11-27 ENCOUNTER — Encounter: Payer: Self-pay | Admitting: Specialist

## 2021-11-27 VITALS — BP 152/98 | HR 76 | Ht <= 58 in | Wt 138.0 lb

## 2021-11-27 DIAGNOSIS — S3210XD Unspecified fracture of sacrum, subsequent encounter for fracture with routine healing: Secondary | ICD-10-CM

## 2021-11-27 DIAGNOSIS — M5441 Lumbago with sciatica, right side: Secondary | ICD-10-CM

## 2021-11-27 DIAGNOSIS — S39013S Strain of muscle, fascia and tendon of pelvis, sequela: Secondary | ICD-10-CM | POA: Diagnosis not present

## 2021-11-27 DIAGNOSIS — S338XXS Sprain of other parts of lumbar spine and pelvis, sequela: Secondary | ICD-10-CM

## 2021-11-27 MED ORDER — GABAPENTIN 100 MG PO CAPS: 100.0000 mg | ORAL_CAPSULE | Freq: Two times a day (BID) | ORAL | 3 refills | Status: DC

## 2021-11-27 NOTE — Progress Notes (Signed)
Office Visit Note   Patient: Brenda Brady           Date of Birth: December 30, 1975           MRN: [id number] Visit Date: 11/27/2021              Requested by: [name], MD [address] [place],  [place] [address] PCP: [name], MD   Assessment & Plan: Visit Diagnoses:  1. Sacrotuberous (ligament) sprain, sequela   2. Strain of muscle, fascia and tendon of pelvis, sequela   3. Low back pain with right-sided sciatica, unspecified back pain laterality, unspecified chronicity     Plan: Avoid frequent bending and stooping  No lifting greater than 10 lbs. May use ice or moist heat for pain. Weight loss is of benefit. Best medication for lumbar disc disease is arthritis medications but due to your history of hepatitis you should not use tylenol or NSAIDs like motrin, celebrex and naprosyn. Will try gabapentin. Order MRI of the lumbar spine due to lumbago with sciatica persisting nearly 7 months post fall with sacral injury. . Exercise is important to improve your indurance and does allow people to function better inspite of back pain.    Follow-Up Instructions: No follow-ups on file.   Orders:  No orders of the defined types were placed in this encounter.  No orders of the defined types were placed in this encounter.     Procedures: No procedures performed   Clinical Data: No additional findings.   Subjective: Chief Complaint  Patient presents with   Lower Back - Pain    46 year old female with 6 month history of buttock pain and radiation in to the right posterior thigh and calf usually not in the foot. Pain is worse with starting to stand and to walk. No bowel or bladder difficulty. No pain with cough or sneeze. Completed PT and has had pain in the lumbar area and into the tailbone post fall. Therapy was done but she still hurts. Not taking any medication      Review of Systems  Constitutional: Negative.   HENT: Negative.    Eyes: Negative.   Respiratory:  Negative.    Cardiovascular: Negative.   Gastrointestinal: Negative.   Endocrine: Negative.   Genitourinary: Negative.   Musculoskeletal: Negative.   Skin: Negative.   Allergic/Immunologic: Negative.   Neurological: Negative.   Hematological: Negative.   Psychiatric/Behavioral: Negative.       Objective: Vital Signs: BP (!) 152/98 (BP Location: Left Arm, Patient Position: Sitting)   Pulse 76   Ht '4\' 9"'$  (1.448 m)   Wt 138 lb (62.6 kg)   BMI 29.86 kg/m   Physical Exam Musculoskeletal:     Lumbar back: Negative right straight leg raise test and negative left straight leg raise test.     Back Exam   Tenderness  The patient is experiencing tenderness in the lumbar.  Range of Motion  Extension:  abnormal  Flexion:  abnormal  Lateral bend right:  abnormal  Lateral bend left:  abnormal  Rotation right:  abnormal   Muscle Strength  Right Quadriceps:  5/5  Left Quadriceps:  5/5  Right Hamstrings:  5/5  Left Hamstrings:  5/5   Tests  Straight leg raise right: negative Straight leg raise left: negative  Reflexes  Patellar:  2/4 Achilles:  2/4  Other  Toe walk: normal Heel walk: normal      Specialty Comments:  No specialty comments available.  Imaging:  No results found.   PMFS History: Patient Active Problem List   Diagnosis Date Noted   Coccydynia 06/02/2021   Lytic lesion of bone on x-ray 06/02/2021   Encounter for annual physical exam 12/24/2020   Microcytic anemia 12/24/2020   Elevated BP without diagnosis of hypertension 12/24/2020   Allergic rhinitis 12/24/2020   COVID-19 12/02/2019   Chronic active viral hepatitis B ([place]) 11/05/2012   OVERWEIGHT 02/07/2009   Past Medical History:  Diagnosis Date   BMI 29.0-29.9,adult    Hepatitis B infection    Hepatitis B, chronic (HCC)    Hepatitis B, chronic (HCC)     No family history on file.  Past Surgical History:  Procedure Laterality Date   VAGINAL DELIVERY     Social History    Occupational History   Not on file  Tobacco Use   Smoking status: Never   Smokeless tobacco: Never  Substance and Sexual Activity   Alcohol use: No   Drug use: No   Sexual activity: Yes    Birth control/protection: I.U.D.

## 2021-11-27 NOTE — Patient Instructions (Signed)
Plan: Avoid frequent bending and stooping  No lifting greater than 10 lbs. May use ice or moist heat for pain. Weight loss is of benefit. Best medication for lumbar disc disease is arthritis medications but due to your history of hepatitis you should not use tylenol or NSAIDs like motrin, celebrex and naprosyn. Will try gabapentin. Order MRI of the lumbar spine due to lumbago with sciatica persisting nearly 7 months post fall with sacral injury. . Exercise is important to improve your indurance and does allow people to function better inspite of back pain.

## 2021-11-28 ENCOUNTER — Encounter: Payer: Self-pay | Admitting: Internal Medicine

## 2021-11-28 ENCOUNTER — Other Ambulatory Visit: Payer: Self-pay

## 2021-11-28 ENCOUNTER — Ambulatory Visit: Payer: 59 | Admitting: Internal Medicine

## 2021-11-28 VITALS — BP 137/89 | HR 78 | Temp 98.0°F | Resp 16 | Wt 138.2 lb

## 2021-11-28 DIAGNOSIS — B181 Chronic viral hepatitis B without delta-agent: Secondary | ICD-10-CM | POA: Diagnosis not present

## 2021-11-28 DIAGNOSIS — R69 Illness, unspecified: Secondary | ICD-10-CM | POA: Diagnosis not present

## 2021-11-28 DIAGNOSIS — Z23 Encounter for immunization: Secondary | ICD-10-CM

## 2021-11-28 MED ORDER — VEMLIDY 25 MG PO TABS: 1.0000 | ORAL_TABLET | Freq: Every day | ORAL | 11 refills | Status: DC

## 2021-11-28 NOTE — Assessment & Plan Note (Signed)
Her chronic hepatitis B has been under excellent control.  She will get updated lab work today and continue [name].  She will follow-up in 1 year.

## 2021-11-28 NOTE — Progress Notes (Signed)
[facility] for [facility]ious [facility]  Patient Active Problem List   Diagnosis Date Noted   Chronic active viral hepatitis B ([place]) 11/05/2012    Priority: High   Coccydynia 06/02/2021   Lytic lesion of bone on x-ray 06/02/2021   Encounter for annual physical exam 12/24/2020   Microcytic anemia 12/24/2020   Elevated BP without diagnosis of hypertension 12/24/2020   Allergic rhinitis 12/24/2020   COVID-19 12/02/2019   OVERWEIGHT 02/07/2009    Patient's Medications  New Prescriptions   No medications on file  Previous Medications   CALCIUM CARB-CHOLECALCIFEROL (CALCIUM 1000 + D PO)    Take by mouth.   DICLOFENAC SODIUM (VOLTAREN) 1 % GEL    Apply 4 g topically 4 (four) times daily.   GABAPENTIN (NEURONTIN) 100 MG CAPSULE    Take 1 capsule (100 mg total) by mouth 2 (two) times daily.  Modified Medications   Modified Medication Previous Medication   TENOFOVIR ALAFENAMIDE FUMARATE (VEMLIDY) 25 MG TABS Tenofovir Alafenamide Fumarate (VEMLIDY) 25 MG TABS      Take 1 tablet (25 mg total) by mouth daily. with food    Take 1 tablet (25 mg total) by mouth daily. with food  Discontinued Medications   No medications on file    Subjective: Brenda Brady is in for her routine follow-up visit.  She has had no problems obtaining, taking or tolerating her Vemlidy.  She recalls missing only 1 dose in the past year.  That occurred when she simply forgot.  She had a fall in May and suffered a sacral fracture.  It has been healing and the pain has been resolving slowly.  She was started on gabapentin yesterday.  Review of Systems: Review of Systems  Constitutional:  Negative for chills, diaphoresis, fever, malaise/fatigue and weight loss.  Gastrointestinal:  Negative for abdominal pain, constipation, diarrhea, nausea and vomiting.    Past Medical History:  Diagnosis Date   BMI 29.0-29.9,adult    Hepatitis B infection    Hepatitis B, chronic (HCC)    Hepatitis B, chronic (HCC)     Social  History   Tobacco Use   Smoking status: Never   Smokeless tobacco: Never  Substance Use Topics   Alcohol use: No   Drug use: No    No family history on file.  No Known Allergies  Objective: Vitals:   11/28/21 1355 11/28/21 1356  BP: (!) 136/92 137/89  Pulse: 78   Resp: 16   Temp: 98 F (36.7 C)   TempSrc: Oral   SpO2: 97%   Weight: 138 lb 3.2 oz (62.7 kg)    Body mass index is 29.91 kg/m.  Physical Exam Constitutional:      Comments: She is in good spirits.  She was examined with the aid of the [facility] interpreter.  Abdominal:     General: There is no distension.     Palpations: Abdomen is soft. There is no mass.     Tenderness: There is no abdominal tenderness.  Neurological:     Mental Status: She is alert and oriented to person, place, and time.     Lab Results 11/06/2020 Liver enzymes normal Hepatitis B e antigen positive Hepatitis B e antibody negative Hepatitis B viral load less than 10  Problem List Items Addressed This Visit       High   Chronic active viral hepatitis B ([place])    Her chronic hepatitis B has been under excellent control.  She will  get updated lab work today and continue [name].  She will follow-up in 1 year.      Relevant Medications   Tenofovir Alafenamide Fumarate (VEMLIDY) 25 MG TABS   Other Relevant Orders   Comprehensive metabolic panel   Hepatitis B DNA, ultraquantitative, PCR   Hepatitis B e antibody   Hepatitis B e antigen     Michel Bickers, MD [facility] [phone] pager   [phone] cell 11/28/2021, 2:20 PM

## 2021-11-30 LAB — COMPREHENSIVE METABOLIC PANEL
AG Ratio: 1.2 (calc) (ref 1.0–2.5)
ALT: 13 U/L (ref 6–29)
AST: 17 U/L (ref 10–35)
Albumin: 4.5 g/dL (ref 3.6–5.1)
Alkaline phosphatase (APISO): 69 U/L (ref 31–125)
BUN: 12 mg/dL (ref 7–25)
CO2: 27 mmol/L (ref 20–32)
Calcium: 9.4 mg/dL (ref 8.6–10.2)
Chloride: 104 mmol/L (ref 98–110)
Creat: 0.69 mg/dL (ref 0.50–0.99)
Globulin: 3.8 g/dL (calc) — ABNORMAL HIGH (ref 1.9–3.7)
Glucose, Bld: 103 mg/dL — ABNORMAL HIGH (ref 65–99)
Potassium: 3.7 mmol/L (ref 3.5–5.3)
Sodium: 139 mmol/L (ref 135–146)
Total Bilirubin: 0.3 mg/dL (ref 0.2–1.2)
Total Protein: 8.3 g/dL — ABNORMAL HIGH (ref 6.1–8.1)

## 2021-11-30 LAB — HEPATITIS B E ANTIBODY: Hep B E Ab: NONREACTIVE

## 2021-11-30 LAB — HEPATITIS B DNA, ULTRAQUANTITATIVE, PCR
Hepatitis B DNA: NOT DETECTED IU/mL
Hepatitis B virus DNA: NOT DETECTED Log IU/mL

## 2021-11-30 LAB — HEPATITIS B E ANTIGEN: Hep B E Ag: REACTIVE — AB

## 2021-12-15 ENCOUNTER — Other Ambulatory Visit: Payer: Self-pay | Admitting: Internal Medicine

## 2021-12-15 DIAGNOSIS — B181 Chronic viral hepatitis B without delta-agent: Secondary | ICD-10-CM

## 2021-12-18 ENCOUNTER — Ambulatory Visit
Admission: RE | Admit: 2021-12-18 | Discharge: 2021-12-18 | Disposition: A | Discharge status: Home / Self Care | Payer: 59 | Source: Ambulatory Visit | Attending: Specialist | Admitting: Specialist

## 2021-12-18 DIAGNOSIS — S3210XD Unspecified fracture of sacrum, subsequent encounter for fracture with routine healing: Secondary | ICD-10-CM

## 2021-12-18 DIAGNOSIS — M5441 Lumbago with sciatica, right side: Secondary | ICD-10-CM

## 2021-12-18 DIAGNOSIS — M545 Low back pain, unspecified: Secondary | ICD-10-CM | POA: Diagnosis not present

## 2021-12-19 ENCOUNTER — Encounter: Payer: Self-pay | Admitting: Orthopaedic Surgery

## 2021-12-19 ENCOUNTER — Ambulatory Visit: Payer: 59 | Admitting: Orthopaedic Surgery

## 2021-12-19 VITALS — BP 126/89 | HR 73 | Ht <= 58 in | Wt 138.0 lb

## 2021-12-19 DIAGNOSIS — M533 Sacrococcygeal disorders, not elsewhere classified: Secondary | ICD-10-CM | POA: Diagnosis not present

## 2021-12-19 NOTE — Progress Notes (Signed)
Office Visit Note   Patient: Brenda Brady           Date of Birth: Nov 12, 1975           MRN: [id number] Visit Date: 12/19/2021              Requested by: [name], MD [place],  [place] [address] PCP: [name], MD   Assessment & Plan: Visit Diagnoses:  1. Coccydynia     Plan: We discussed occasional Aleve she has had hepatitis in the past but SGOT, STPT, bilirubin are all normal x3 years.  Discussed with her symptoms will continue to improve she can use a small pillow underneath the buttocks and then switch to the opposite side intermittently.  She can return in 6 months if she still having persistent symptoms.  I discussed with her that this will continue to improve.  Follow-Up Instructions: Return if symptoms worsen or fail to improve.   Orders:  No orders of the defined types were placed in this encounter.  No orders of the defined types were placed in this encounter.     Procedures: No procedures performed   Clinical Data: No additional findings.   Subjective: Chief Complaint  Patient presents with   Lower Back - Pain, Follow-up    MRI review Follow up sacral fx    HPI 46 year old female here with an interpreter follow-up sacral fracture occurred when she fell backwards landing on her buttocks.  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  She had some physical therapy with some improvement.  CT scan was performed that showed fracture and more recently follow-up MRI scan ordered by Dr. Louanne Skye showed mild lumbar bulging but no impingement or compression of the nerve centrally foraminally or lateral recess.  Area of the sacrum that was fractured appears healed on MRI scan.  Review of Systems All systems noncontributory to HPI.  Objective: Vital Signs: BP 126/89   Pulse 73   Ht '4\' 9"'$  (1.448 m)   Wt 138 lb (62.6 kg)   BMI 29.86 kg/m   Physical Exam Constitutional:      Appearance: She is well-developed.  HENT:     Head:  Normocephalic.     Right Ear: External ear normal.     Left Ear: External ear normal. There is no impacted cerumen.  Eyes:     Pupils: Pupils are equal, round, and reactive to light.  Neck:     Thyroid: No thyromegaly.     Trachea: No tracheal deviation.  Cardiovascular:     Rate and Rhythm: Normal rate.  Pulmonary:     Effort: Pulmonary effort is normal.  Abdominal:     Palpations: Abdomen is soft.  Musculoskeletal:     Cervical back: No rigidity.  Skin:    General: Skin is warm and dry.  Neurological:     Mental Status: She is alert and oriented to person, place, and time.  Psychiatric:        Behavior: Behavior normal.     Ortho Exam patient has normal gait sequence  Lower extremities mild trochanteric bursal tenderness.  No sciatic notch tenderness.  No tenderness over the lower sacrum and coccyx.  Specialty Comments:  No specialty comments available.  Imaging: MR Lumbar Spine w/o contrast  Result Date: 12/18/2021 CLINICAL DATA:  Lower back pain with symptoms persisting over 6 weeks of treatment. EXAM: MRI LUMBAR SPINE WITHOUT CONTRAST TECHNIQUE: Multiplanar, multisequence MR imaging of the lumbar spine was  performed. No intravenous contrast was administered. COMPARISON:  None Available. FINDINGS: Segmentation:  Standard. Alignment:  Physiologic. Vertebrae:  No fracture, evidence of discitis, or bone lesion. Conus medullaris and cauda equina: Conus extends to the L1-2 level. Conus and cauda equina appear normal. Paraspinal and other soft tissues: Negative for perispinal mass or inflammation. Disc levels: Disc desiccation and narrowing with mild bulge at L3-4 and below. Negative facets. No neural impingement. IMPRESSION: Mild lumbar disc bulging. No neural impingement or visible inflammation. Electronically Signed   By: Jorje Guild M.D.   On: 12/18/2021 18:16     PMFS History: Patient Active Problem List   Diagnosis Date Noted   Coccydynia 06/02/2021   Lytic lesion  of bone on x-ray 06/02/2021   Encounter for annual physical exam 12/24/2020   Microcytic anemia 12/24/2020   Elevated BP without diagnosis of hypertension 12/24/2020   Allergic rhinitis 12/24/2020   COVID-19 12/02/2019   Chronic active viral hepatitis B ([place]) 11/05/2012   OVERWEIGHT 02/07/2009   Past Medical History:  Diagnosis Date   BMI 29.0-29.9,adult    Hepatitis B infection    Hepatitis B, chronic (HCC)    Hepatitis B, chronic (HCC)     No family history on file.  Past Surgical History:  Procedure Laterality Date   VAGINAL DELIVERY     Social History   Occupational History   Not on file  Tobacco Use   Smoking status: Never   Smokeless tobacco: Never  Substance and Sexual Activity   Alcohol use: No   Drug use: No   Sexual activity: Yes    Birth control/protection: I.U.D.

## 2022-01-01 DIAGNOSIS — I10 Essential (primary) hypertension: Secondary | ICD-10-CM | POA: Diagnosis not present

## 2022-01-01 DIAGNOSIS — Z823 Family history of stroke: Secondary | ICD-10-CM | POA: Diagnosis not present

## 2022-01-01 DIAGNOSIS — Z8249 Family history of ischemic heart disease and other diseases of the circulatory system: Secondary | ICD-10-CM | POA: Diagnosis not present

## 2022-01-02 ENCOUNTER — Other Ambulatory Visit: Payer: Self-pay | Admitting: Internal Medicine

## 2022-01-02 DIAGNOSIS — B181 Chronic viral hepatitis B without delta-agent: Secondary | ICD-10-CM

## 2022-01-03 NOTE — Telephone Encounter (Signed)
Called patient to confirm pharmacy utilizing [facility] interpreting service (medication recently refilled at [facility]). Call went to voicemail. Via interpreter, left message requesting call back.  Binnie Kand, RN

## 2022-01-09 ENCOUNTER — Encounter: Payer: Self-pay | Admitting: Internal Medicine

## 2022-01-10 ENCOUNTER — Other Ambulatory Visit: Payer: Self-pay

## 2022-01-10 ENCOUNTER — Other Ambulatory Visit: Payer: Self-pay | Admitting: Internal Medicine

## 2022-01-10 DIAGNOSIS — B181 Chronic viral hepatitis B without delta-agent: Secondary | ICD-10-CM

## 2022-01-10 MED ORDER — VEMLIDY 25 MG PO TABS: 1.0000 | ORAL_TABLET | Freq: Every day | ORAL | 11 refills | Status: DC

## 2022-01-10 NOTE — Progress Notes (Signed)
    SUBJECTIVE:   CHIEF COMPLAINT / HPI:  Chief Complaint  Patient presents with   Annual Exam    No concerns today. [place] in-person interpreter present.  She has had some intermittent rhinorrhea and sneezing which is mild.  She is still working at the [employer].  She has stopped going to the gym ever since her sacral fracture.  Not doing much exercise nowadays.  Difficult to go walking outside due to cold weather.  Still having some sacral pain and occasionally takes gabapentin.  Her husband has not been doing well due to pain from sciatica.  He had a surgery for this which was not successful.  Husband has been out of work for some period of time.   PERTINENT  PMH / PSH: chronic HBV, sacral fracture  Patient Care Team: [name], MD as PCP - General (Family Medicine)   OBJECTIVE:   BP 109/76   Pulse 81   Ht '4\' 9"'$  (1.448 m)   Wt 137 lb 2 oz (62.2 kg)   LMP 12/25/2021   SpO2 100%   BMI 29.67 kg/m   Physical Exam Constitutional:      General: She is not in acute distress. HENT:     Head: Normocephalic and atraumatic.  Neck:     Comments: No thyromegaly Cardiovascular:     Rate and Rhythm: Normal rate and regular rhythm.  Pulmonary:     Effort: Pulmonary effort is normal. No respiratory distress.     Breath sounds: Normal breath sounds.  Abdominal:     General: Bowel sounds are normal.     Palpations: Abdomen is soft.     Tenderness: There is no abdominal tenderness.  Musculoskeletal:     Cervical back: Neck supple.  Neurological:     Mental Status: She is alert.         01/12/2022    8:55 AM  Depression screen PHQ 2/9  Decreased Interest 2  Down, Depressed, Hopeless 1  PHQ - 2 Score 3  Altered sleeping 0  Tired, decreased energy 1  Change in appetite 0  Feeling bad or failure about yourself  0  Trouble concentrating 0  Moving slowly or fidgety/restless 0  Suicidal thoughts 0  PHQ-9 Score 4  Difficult doing work/chores Not difficult at all      {Show previous vital signs (optional):23777}    ASSESSMENT/PLAN:   Encounter for routine history and physical examination of adult  Hyperlipidemia Mildly elevated LDL on last year's labs 112.  Not requiring statin.  Will defer labs this year and plan to check lipid panel next year.   HCM - colonoscopy referral re-sent, previously referred but patient never received a phone call.  Contact information provided.  Return in about 1 year (around 01/13/2023) for physical.   [name], [place]

## 2022-01-10 NOTE — Patient Instructions (Addendum)
It was nice seeing you today!  Referral to GI for colonoscopy for colon cancer screening.  [facility] [address], [place],  [address] Phone: [phone]  Stay well, [name], MD [place] [phone]  --  Make sure to check out at the front desk before you leave today.  Please arrive at least 15 minutes prior to your scheduled appointments.  If you had blood work today, I will send you a MyChart message or a letter if results are normal. Otherwise, I will give you a call.  If you had a referral placed, they will call you to set up an appointment. Please give [name] a call if you don't hear back in the next 2 weeks.  If you need additional refills before your next appointment, please call your pharmacy first.

## 2022-01-12 ENCOUNTER — Encounter: Payer: Self-pay | Admitting: Family Medicine

## 2022-01-12 ENCOUNTER — Ambulatory Visit (INDEPENDENT_AMBULATORY_CARE_PROVIDER_SITE_OTHER): Payer: 59 | Admitting: Family Medicine

## 2022-01-12 VITALS — BP 109/76 | HR 81 | Ht <= 58 in | Wt 137.1 lb

## 2022-01-12 DIAGNOSIS — E782 Mixed hyperlipidemia: Secondary | ICD-10-CM

## 2022-01-12 DIAGNOSIS — Z1211 Encounter for screening for malignant neoplasm of colon: Secondary | ICD-10-CM

## 2022-01-12 DIAGNOSIS — Z0000 Encounter for general adult medical examination without abnormal findings: Secondary | ICD-10-CM

## 2022-01-12 DIAGNOSIS — E785 Hyperlipidemia, unspecified: Secondary | ICD-10-CM | POA: Insufficient documentation

## 2022-01-12 NOTE — Assessment & Plan Note (Addendum)
Mildly elevated LDL on last year's labs 112.  Not requiring statin.  Will defer labs this year and plan to check lipid panel next year.

## 2022-01-15 ENCOUNTER — Encounter: Payer: Self-pay | Admitting: Internal Medicine

## 2022-02-21 ENCOUNTER — Ambulatory Visit (AMBULATORY_SURGERY_CENTER): Payer: Self-pay

## 2022-02-21 VITALS — Ht <= 58 in | Wt 139.0 lb

## 2022-02-21 DIAGNOSIS — Z1211 Encounter for screening for malignant neoplasm of colon: Secondary | ICD-10-CM

## 2022-02-21 MED ORDER — NA SULFATE-K SULFATE-MG SULF 17.5-3.13-1.6 GM/177ML PO SOLN: 1.0000 | Freq: Once | ORAL | 0 refills | Status: AC

## 2022-02-21 NOTE — Progress Notes (Signed)

## 2022-03-06 ENCOUNTER — Encounter: Payer: Self-pay | Admitting: Internal Medicine

## 2022-03-08 ENCOUNTER — Encounter: Payer: Self-pay | Admitting: Certified Registered Nurse Anesthetist

## 2022-03-14 ENCOUNTER — Encounter: Payer: Self-pay | Admitting: Internal Medicine

## 2022-03-14 ENCOUNTER — Ambulatory Visit (AMBULATORY_SURGERY_CENTER): Payer: 59 | Admitting: Internal Medicine

## 2022-03-14 VITALS — BP 104/80 | HR 68 | Temp 98.4°F | Resp 24 | Ht <= 58 in | Wt 134.0 lb

## 2022-03-14 DIAGNOSIS — D123 Benign neoplasm of transverse colon: Secondary | ICD-10-CM

## 2022-03-14 DIAGNOSIS — Z1211 Encounter for screening for malignant neoplasm of colon: Secondary | ICD-10-CM | POA: Diagnosis not present

## 2022-03-14 HISTORY — PX: COLONOSCOPY: SHX174

## 2022-03-14 MED ORDER — SODIUM CHLORIDE 0.9 % IV SOLN: 500.0000 mL | Freq: Once | INTRAVENOUS | Status: DC

## 2022-03-14 NOTE — Progress Notes (Signed)
Report given to PACU, vss 

## 2022-03-14 NOTE — Progress Notes (Signed)
GASTROENTEROLOGY PROCEDURE H&P NOTE   Primary Care Physician: [name], MD    Reason for Procedure:   Colon cancer screening  Plan:    Colonoscopy  Patient is appropriate for endoscopic procedure(s) in the ambulatory ([facility]) setting.  The nature of the procedure, as well as the risks, benefits, and alternatives were carefully and thoroughly reviewed with the patient. Ample time for discussion and questions allowed. The patient understood, was satisfied, and agreed to proceed.     HPI: Brenda Brady is a 47 y.o. female who presents for colonoscopy for colon cancer screening. Denies blood in stools, changes in bowel habits, or unintentional weight loss. Denies family history of colon cancer. She has never had a colonoscopy before.  Past Medical History:  Diagnosis Date   Allergy    Anemia    BMI 29.0-29.9,adult    GERD (gastroesophageal reflux disease)    Hepatitis B infection    Hepatitis B, chronic (HCC)    Hepatitis B, chronic (HCC)     Past Surgical History:  Procedure Laterality Date   VAGINAL DELIVERY      Prior to Admission medications   Medication Sig Start Date End Date Taking? Authorizing Provider  Calcium Carb-Cholecalciferol (CALCIUM 1000 + D PO) Take by mouth.    [provider]  diclofenac Sodium (VOLTAREN) 1 % GEL Apply 4 g topically 4 (four) times daily. Patient not taking: Reported on 02/21/2022 10/25/21   Jessy Oto, MD  gabapentin (NEURONTIN) 100 MG capsule Take 1 capsule (100 mg total) by mouth 2 (two) times daily. Patient taking differently: Take 100 mg by mouth 2 (two) times daily as needed. 11/27/21   Jessy Oto, MD  Tenofovir Alafenamide Fumarate (VEMLIDY) 25 MG TABS Take 1 tablet (25 mg total) by mouth daily. with food 01/10/22   Michel Bickers, MD    Current Outpatient Medications  Medication Sig Dispense Refill   Calcium Carb-Cholecalciferol (CALCIUM 1000 + D PO) Take by mouth.     diclofenac Sodium (VOLTAREN) 1 % GEL Apply 4 g  topically 4 (four) times daily. (Patient not taking: Reported on 02/21/2022) 350 g 4   gabapentin (NEURONTIN) 100 MG capsule Take 1 capsule (100 mg total) by mouth 2 (two) times daily. (Patient taking differently: Take 100 mg by mouth 2 (two) times daily as needed.) 60 capsule 3   Tenofovir Alafenamide Fumarate (VEMLIDY) 25 MG TABS Take 1 tablet (25 mg total) by mouth daily. with food 30 tablet 11   No current facility-administered medications for this visit.    Allergies as of 03/14/2022   (No Known Allergies)    Family History  Problem Relation Age of Onset   Colon cancer Neg Hx    Colon polyps Neg Hx    Esophageal cancer Neg Hx    Rectal cancer Neg Hx    Stomach cancer Neg Hx     Social History   Socioeconomic History   Marital status: Married    Spouse name: Not on file   Number of children: Not on file   Years of education: Not on file   Highest education level: Not on file  Occupational History   Not on file  Tobacco Use   Smoking status: Never   Smokeless tobacco: Never  Vaping Use   Vaping Use: Never used  Substance and Sexual Activity   Alcohol use: No   Drug use: No   Sexual activity: Yes  Other Topics Concern   Not on file  Social History  Narrative   Not on file   Social Determinants of Health   Financial Resource Strain: Not on file  Food Insecurity: Not on file  Transportation Needs: Not on file  Physical Activity: Not on file  Stress: Not on file  Social Connections: Not on file  Intimate Partner Violence: Not on file    Physical Exam: Vital signs in last 24 hours: BP 112/74   Pulse 80   Temp 98.4 F (36.9 C) (Temporal)   Ht '4\' 9"'$  (1.448 m)   Wt 134 lb (60.8 kg)   LMP 02/27/2022 (Approximate)   SpO2 99%   BMI 29.00 kg/m  GEN: NAD EYE: Sclerae anicteric ENT: MMM CV: Non-tachycardic Pulm: No increased work of breathing GI: Soft, NT/ND NEURO:  Alert & Oriented   Christia Reading, MD [facility]  03/14/2022 11:17 AM

## 2022-03-14 NOTE — Progress Notes (Signed)
Interpeter present during patient's recovery and care partner is as well. Interpreter is Mr. Blenda Mounts.

## 2022-03-14 NOTE — Progress Notes (Signed)
Called to room to assist during endoscopic procedure.  Patient ID and intended procedure confirmed with present staff. Received instructions for my participation in the procedure from the performing physician.  

## 2022-03-14 NOTE — Patient Instructions (Addendum)
You may resume all of your previous medications today.  Handouts on polyps & hemorrhoids given to you today  YOU HAD AN ENDOSCOPIC PROCEDURE TODAY AT [facility]:   Refer to the procedure report that was given to you for any specific questions about what was found during the examination.  If the procedure report does not answer your questions, please call your gastroenterologist to clarify.  If you requested that your care partner not be given the details of your procedure findings, then the procedure report has been included in a sealed envelope for you to review at your convenience later.  YOU SHOULD EXPECT: Some feelings of bloating in the abdomen. Passage of more gas than usual.  Walking can help get rid of the air that was put into your GI tract during the procedure and reduce the bloating. If you had a lower endoscopy (such as a colonoscopy or flexible sigmoidoscopy) you may notice spotting of blood in your stool or on the toilet paper. If you underwent a bowel prep for your procedure, you may not have a normal bowel movement for a few days.  Please Note:  You might notice some irritation and congestion in your nose or some drainage.  This is from the oxygen used during your procedure.  There is no need for concern and it should clear up in a day or so.  SYMPTOMS TO REPORT IMMEDIATELY:  Following lower endoscopy (colonoscopy or flexible sigmoidoscopy):  Excessive amounts of blood in the stool  Significant tenderness or worsening of abdominal pains  Swelling of the abdomen that is new, acute  Fever of 100F or higher   For urgent or emergent issues, a gastroenterologist can be reached at any hour by calling [phone]. Do not use MyChart messaging for urgent concerns.    DIET:  We do recommend a small meal at first, but then you may proceed to your regular diet.  Drink plenty of fluids but you should avoid alcoholic beverages for 24 hours.  ACTIVITY:  You should plan  to take it easy for the rest of today and you should NOT DRIVE or use heavy machinery until tomorrow (because of the sedation medicines used during the test).    FOLLOW UP: Our staff will call the number listed on your records the next business day following your procedure.  We will call around 7:15- 8:00 am to check on you and address any questions or concerns that you may have regarding the information given to you following your procedure. If we do not reach you, we will leave a message.     If any biopsies were taken you will be contacted by phone or by letter within the next 1-3 weeks.  Please call us at [phone] if you have not heard about the biopsies in 3 weeks.    SIGNATURES/CONFIDENTIALITY: You and/or your care partner have signed paperwork which will be entered into your electronic medical record.  These signatures attest to the fact that that the information above on your After Visit Summary has been reviewed and is understood.  Full responsibility of the confidentiality of this discharge information lies with you and/or your care-partner.

## 2022-03-14 NOTE — Progress Notes (Signed)
Pt's states no medical or surgical changes since previsit or office visit. 

## 2022-03-14 NOTE — Op Note (Signed)
[facility] Patient Name: Brenda Brady Procedure Date: 03/14/2022 11:30 AM MRN: [id number] Endoscopist: Adline Mango [place] , , [id number] Age: 47 Referring MD:  Date of Birth: Jun 14, 1975 Gender: Female Account #: [id number] Procedure:                Colonoscopy Indications:              Screening for colorectal malignant neoplasm, This                            is the patient's first colonoscopy Medicines:                Monitored Anesthesia Care Procedure:                Pre-Anesthesia Assessment:                           - Prior to the procedure, a History and Physical                            was performed, and patient medications and                            allergies were reviewed. The patient's tolerance of                            previous anesthesia was also reviewed. The risks                            and benefits of the procedure and the sedation                            options and risks were discussed with the patient.                            All questions were answered, and informed consent                            was obtained. Prior Anticoagulants: The patient has                            taken no anticoagulant or antiplatelet agents. ASA                            Grade Assessment: II - A patient with mild systemic                            disease. After reviewing the risks and benefits,                            the patient was deemed in satisfactory condition to                            undergo the procedure.  After obtaining informed consent, the colonoscope                            was passed under direct vision. Throughout the                            procedure, the patient's blood pressure, pulse, and                            oxygen saturations were monitored continuously. The                            Olympus PCF-H190DL (#[id number]) Colonoscope was                            introduced through the anus  and advanced to the the                            terminal ileum. The colonoscopy was performed                            without difficulty. The patient tolerated the                            procedure well. The quality of the bowel                            preparation was good. The terminal ileum, ileocecal                            valve, appendiceal orifice, and rectum were                            photographed. Scope In: 11:34:17 AM Scope Out: 11:44:57 AM Scope Withdrawal Time: 0 hours 7 minutes 44 seconds  Total Procedure Duration: 0 hours 10 minutes 40 seconds  Findings:                 The terminal ileum appeared normal.                           A 3 mm polyp was found in the transverse colon. The                            polyp was sessile. The polyp was removed with a                            cold snare. Resection and retrieval were complete.                           Non-bleeding internal hemorrhoids were found during                            retroflexion. Complications:  No immediate complications. Estimated Blood Loss:     Estimated blood loss was minimal. Impression:               - The examined portion of the ileum was normal.                           - One 3 mm polyp in the transverse colon, removed                            with a cold snare. Resected and retrieved.                           - Non-bleeding internal hemorrhoids. Recommendation:           - Discharge patient to home (with escort).                           - Await pathology results.                           - The findings and recommendations were discussed                            with the patient. Dr Georgian Co "Lyndee Leo" [place],  03/14/2022 11:47:21 AM

## 2022-03-15 ENCOUNTER — Telehealth: Payer: Self-pay | Admitting: *Deleted

## 2022-03-15 NOTE — Telephone Encounter (Signed)
  Follow up Call-     03/14/2022   11:11 AM  Call back number  Post procedure Call Back phone  # [phone]  Permission to leave phone message Yes     Patient questions:  Do you have a fever, pain , or abdominal swelling? No. Pain Score  0 *  Have you tolerated food without any problems? Yes.    Have you been able to return to your normal activities? Yes.    Do you have any questions about your discharge instructions: Diet   No. Medications  No. Follow up visit  No.  Do you have questions or concerns about your Care? No.  Actions: * If pain score is 4 or above: No action needed, pain <4.

## 2022-03-16 ENCOUNTER — Encounter: Payer: Self-pay | Admitting: Internal Medicine

## 2022-06-25 ENCOUNTER — Ambulatory Visit (HOSPITAL_COMMUNITY)
Admission: EM | Admit: 2022-06-25 | Discharge: 2022-06-25 | Disposition: A | Discharge status: Home / Self Care | Payer: 59 | Attending: Urgent Care | Admitting: Urgent Care

## 2022-06-25 ENCOUNTER — Encounter (HOSPITAL_COMMUNITY): Payer: Self-pay | Admitting: *Deleted

## 2022-06-25 ENCOUNTER — Other Ambulatory Visit: Payer: Self-pay

## 2022-06-25 DIAGNOSIS — J069 Acute upper respiratory infection, unspecified: Secondary | ICD-10-CM | POA: Diagnosis not present

## 2022-06-25 LAB — POCT RAPID STREP A (OFFICE): Rapid Strep A Screen: NEGATIVE

## 2022-06-25 MED ORDER — LEVOCETIRIZINE DIHYDROCHLORIDE 5 MG PO TABS: 5.0000 mg | ORAL_TABLET | Freq: Every evening | ORAL | 0 refills | Status: AC

## 2022-06-25 MED ORDER — BENZONATATE 100 MG PO CAPS: 100.0000 mg | ORAL_CAPSULE | Freq: Three times a day (TID) | ORAL | 0 refills | Status: DC

## 2022-06-25 MED ORDER — FLUTICASONE PROPIONATE 50 MCG/ACT NA SUSP: 1.0000 | Freq: Every day | NASAL | 0 refills | Status: AC

## 2022-06-25 NOTE — Discharge Instructions (Signed)
Your sore throat is likely related to a virus. Your strep test is negative.  We will call with the results of the throat swab if it requires treatment with antibiotics.  Please alternate tylenol with ibuprofen to help with the discomfort or fever. Max dose tylenol is 1000mg  per dose, or 3000mg  per day. Max dose Ibuprofen is 800mg  per dose or 2400mg  per day. Take with food.  You may also try chloraseptic spray or cepacol lozenges.  I have called in a medication to help with your cough.  Take this up to 3 times daily as needed. I have also called in an antihistamine and a nasal spray to help with your congestion and sore throat. Monitor for fever >100.3, swollen lymph nodes or white spots on the back of the throat which may indicate a need to return to our clinic.

## 2022-06-25 NOTE — ED Triage Notes (Signed)
Pt reports sore throat ,cough ,congestion started on Friday. Pt has also had chills.

## 2022-06-25 NOTE — ED Provider Notes (Signed)
[facility]    CSN: [id number] Arrival date & time: 06/25/22  0802      History   Chief Complaint Chief Complaint  Patient presents with   Sore Throat   Cough   Nasal Congestion    HPI Brenda Brady is a 47 y.o. female.   Pleasant 47 year old female presents today due to concerns of sore throat, sneezing, nasal congestion, chills, and a cough with white sputum since Friday.  She denies any sick contacts.  No exposure to small children.  She denies a fever, but states she has been chilled.  She has taken DayQuil over-the-counter without any symptom relief.  She does have a history of allergies.  She denies shortness of breath, tightness in the chest, wheezing, chest pain, or palpitations.  She denies any sinus pain or ear pain, although she states her ears do itch sometimes.  She reported some nausea over the weekend, 3 bouts of diarrhea on Saturday.  Denies any vomiting.  States the diarrhea seems to have resolved.   Sore Throat  Cough   Past Medical History:  Diagnosis Date   Allergy    Anemia    BMI 29.0-29.9,adult    GERD (gastroesophageal reflux disease)    Hepatitis B infection    Hepatitis B, chronic (HCC)    Hepatitis B, chronic (HCC)     Patient Active Problem List   Diagnosis Date Noted   Hyperlipidemia 01/12/2022   Coccydynia 06/02/2021   Microcytic anemia 12/24/2020   Elevated BP without diagnosis of hypertension 12/24/2020   Allergic rhinitis 12/24/2020   Chronic active viral hepatitis B (HCC) 11/05/2012   OVERWEIGHT 02/07/2009    Past Surgical History:  Procedure Laterality Date   COLONOSCOPY  03/14/2022   VAGINAL DELIVERY      OB History   No obstetric history on file.      Home Medications    Prior to Admission medications   Medication Sig Start Date End Date Taking? Authorizing Provider  benzonatate (TESSALON) 100 MG capsule Take 1 capsule (100 mg total) by mouth every 8 (eight) hours. 06/25/22  Yes Owens Hara L, Dimple   fluticasone (FLONASE) 50 MCG/ACT nasal spray Place 1 spray into both nostrils daily. 06/25/22  Yes Zoua Caporaso L, Srihitha  levocetirizine (XYZAL) 5 MG tablet Take 1 tablet (5 mg total) by mouth every evening. 06/25/22  Yes Avni Traore L, Alsie  Calcium Carb-Cholecalciferol (CALCIUM 1000 + D PO) Take by mouth.    [provider]  Tenofovir Alafenamide Fumarate (VEMLIDY) 25 MG TABS Take 1 tablet (25 mg total) by mouth daily. with food 01/10/22   Cliffton Asters, MD    Family History Family History  Problem Relation Age of Onset   Colon cancer Neg Hx    Colon polyps Neg Hx    Esophageal cancer Neg Hx    Rectal cancer Neg Hx    Stomach cancer Neg Hx     Social History Social History   Tobacco Use   Smoking status: Never   Smokeless tobacco: Never  Vaping Use   Vaping Use: Never used  Substance Use Topics   Alcohol use: No   Drug use: No     Allergies   Patient has no known allergies.   Review of Systems Review of Systems  Respiratory:  Positive for cough.   As per HPI   Physical Exam Triage Vital Signs ED Triage Vitals  Enc Vitals Group     BP 06/25/22 0827 (!) 146/95  Pulse Rate 06/25/22 0827 67     Resp 06/25/22 0827 18     Temp 06/25/22 0827 98.6 F (37 C)     Temp src --      SpO2 06/25/22 0827 98 %     Weight --      Height --      Head Circumference --      Peak Flow --      Pain Score 06/25/22 0825 8     Pain Loc --      Pain Edu? --      Excl. in GC? --    No data found.  Updated Vital Signs BP (!) 146/95   Pulse 67   Temp 98.6 F (37 C)   Resp 18   LMP 05/26/2022   SpO2 98%   Visual Acuity Right Eye Distance:   Left Eye Distance:   Bilateral Distance:    Right Eye Near:   Left Eye Near:    Bilateral Near:     Physical Exam Vitals and nursing note reviewed.  Constitutional:      General: She is not in acute distress.    Appearance: She is well-developed and normal weight. She is not ill-appearing, toxic-appearing or  diaphoretic.  HENT:     Head: Normocephalic and atraumatic.     Right Ear: Tympanic membrane and ear canal normal. No drainage, swelling or tenderness. No middle ear effusion. Tympanic membrane is not erythematous.     Left Ear: Tympanic membrane and ear canal normal. No drainage, swelling or tenderness.  No middle ear effusion. Tympanic membrane is not erythematous.     Nose: Congestion and rhinorrhea present.     Mouth/Throat:     Mouth: Mucous membranes are moist. No oral lesions.     Pharynx: Oropharynx is clear. No pharyngeal swelling, oropharyngeal exudate, posterior oropharyngeal erythema or uvula swelling.     Tonsils: No tonsillar exudate or tonsillar abscesses.  Eyes:     Extraocular Movements:     Right eye: Normal extraocular motion.     Left eye: Normal extraocular motion.     Conjunctiva/sclera: Conjunctivae normal.     Pupils: Pupils are equal, round, and reactive to light.  Neck:     Thyroid: No thyromegaly.  Cardiovascular:     Rate and Rhythm: Normal rate.     Heart sounds: Normal heart sounds. No murmur heard. Pulmonary:     Effort: Pulmonary effort is normal. No respiratory distress.     Breath sounds: Normal breath sounds. No stridor. No wheezing, rhonchi or rales.  Chest:     Chest wall: No tenderness.  Abdominal:     General: Bowel sounds are normal. There is no distension.     Palpations: Abdomen is soft. There is no mass.     Tenderness: There is no abdominal tenderness. There is no guarding or rebound.     Hernia: No hernia is present.  Musculoskeletal:     Cervical back: Normal range of motion and neck supple.  Lymphadenopathy:     Cervical: No cervical adenopathy.  Skin:    General: Skin is warm.     Capillary Refill: Capillary refill takes less than 2 seconds.     Coloration: Skin is not pale.     Findings: No erythema or rash.  Neurological:     General: No focal deficit present.     Mental Status: She is alert and oriented to person, place, and  time.  Psychiatric:  Mood and Affect: Mood normal.        Behavior: Behavior normal.      UC Treatments / Results  Labs (all labs ordered are listed, but only abnormal results are displayed) Labs Reviewed  CULTURE, GROUP A STREP [facility])  POCT RAPID STREP A (OFFICE)    EKG   Radiology No results found.  Procedures Procedures (including critical care time)  Medications Ordered in UC Medications - No data to display  Initial Impression / Assessment and Plan / UC Course  I have reviewed the triage vital signs and the nursing notes.  Pertinent labs & imaging results that were available during my care of the patient were reviewed by me and considered in my medical decision making (see chart for details).     Viral URI with cough -strep test negative.  Discussed symptomatic treatment with Xyzal and Flonase.  I also called in Tessalon Perles to help with patient's cough.   Final Clinical Impressions(s) / UC Diagnoses   Final diagnoses:  Viral URI with cough     Discharge Instructions      Your sore throat is likely related to a virus. Your strep test is negative.  We will call with the results of the throat swab if it requires treatment with antibiotics.  Please alternate tylenol with ibuprofen to help with the discomfort or fever. Max dose tylenol is 1000mg  per dose, or 3000mg  per day. Max dose Ibuprofen is 800mg  per dose or 2400mg  per day. Take with food.  You may also try chloraseptic spray or cepacol lozenges.  I have called in a medication to help with your cough.  Take this up to 3 times daily as needed. I have also called in an antihistamine and a nasal spray to help with your congestion and sore throat. Monitor for fever >100.3, swollen lymph nodes or white spots on the back of the throat which may indicate a need to return to our clinic.      ED Prescriptions     Medication Sig Dispense Auth. Provider   levocetirizine (XYZAL) 5 MG tablet Take 1  tablet (5 mg total) by mouth every evening. 30 tablet Masen Luallen L, Arly   fluticasone (FLONASE) 50 MCG/ACT nasal spray Place 1 spray into both nostrils daily. 16 mL Briella Hobday L, Kimmy   benzonatate (TESSALON) 100 MG capsule Take 1 capsule (100 mg total) by mouth every 8 (eight) hours. 21 capsule Woodard Perrell L, Conita      PDMP not reviewed this encounter.   Maretta Bees, [place] 06/25/22 1500

## 2022-06-26 LAB — CULTURE, GROUP A STREP (THRC)

## 2022-06-27 LAB — CULTURE, GROUP A STREP (THRC)

## 2022-06-28 DIAGNOSIS — B181 Chronic viral hepatitis B without delta-agent: Secondary | ICD-10-CM | POA: Diagnosis not present

## 2022-06-28 DIAGNOSIS — R030 Elevated blood-pressure reading, without diagnosis of hypertension: Secondary | ICD-10-CM | POA: Diagnosis not present

## 2022-06-28 DIAGNOSIS — D8481 Immunodeficiency due to conditions classified elsewhere: Secondary | ICD-10-CM | POA: Diagnosis not present

## 2022-06-28 DIAGNOSIS — R32 Unspecified urinary incontinence: Secondary | ICD-10-CM | POA: Diagnosis not present

## 2022-08-16 ENCOUNTER — Other Ambulatory Visit: Payer: Self-pay

## 2022-08-16 ENCOUNTER — Other Ambulatory Visit (HOSPITAL_COMMUNITY): Payer: Self-pay

## 2022-08-16 DIAGNOSIS — B181 Chronic viral hepatitis B without delta-agent: Secondary | ICD-10-CM

## 2022-08-16 MED ORDER — VEMLIDY 25 MG PO TABS
1.0000 | ORAL_TABLET | Freq: Every day | ORAL | 1 refills | Status: DC
Start: 2022-08-16 — End: 2022-09-13

## 2022-09-13 ENCOUNTER — Ambulatory Visit (INDEPENDENT_AMBULATORY_CARE_PROVIDER_SITE_OTHER): Payer: 59 | Admitting: Internal Medicine

## 2022-09-13 ENCOUNTER — Other Ambulatory Visit: Payer: Self-pay

## 2022-09-13 ENCOUNTER — Encounter: Payer: Self-pay | Admitting: Internal Medicine

## 2022-09-13 VITALS — BP 135/84 | HR 74 | Resp 16 | Ht <= 58 in | Wt 140.0 lb

## 2022-09-13 DIAGNOSIS — B181 Chronic viral hepatitis B without delta-agent: Secondary | ICD-10-CM

## 2022-09-13 LAB — CBC WITH DIFFERENTIAL/PLATELET
Absolute Monocytes: 735 cells/uL (ref 200–950)
Basophils Absolute: 69 cells/uL (ref 0–200)
Basophils Relative: 0.7 %
Eosinophils Absolute: 314 cells/uL (ref 15–500)
Eosinophils Relative: 3.2 %
HCT: 40.2 % (ref 35.0–45.0)
Hemoglobin: 12.3 g/dL (ref 11.7–15.5)
Lymphs Abs: 3116 cells/uL (ref 850–3900)
MCH: 21.9 pg — ABNORMAL LOW (ref 27.0–33.0)
MCHC: 30.6 g/dL — ABNORMAL LOW (ref 32.0–36.0)
MCV: 71.5 fL — ABNORMAL LOW (ref 80.0–100.0)
MPV: 10.6 fL (ref 7.5–12.5)
Monocytes Relative: 7.5 %
Neutro Abs: 5566 cells/uL (ref 1500–7800)
Neutrophils Relative %: 56.8 %
Platelets: 356 10*3/uL (ref 140–400)
RBC: 5.62 10*6/uL — ABNORMAL HIGH (ref 3.80–5.10)
RDW: 15.8 % — ABNORMAL HIGH (ref 11.0–15.0)
Total Lymphocyte: 31.8 %
WBC: 9.8 10*3/uL (ref 3.8–10.8)

## 2022-09-13 MED ORDER — TENOFOVIR ALAFENAMIDE FUMARATE 25 MG PO TABS: 1.0000 | ORAL_TABLET | Freq: Every day | ORAL | 11 refills | Status: DC

## 2022-09-13 NOTE — Patient Instructions (Signed)
Your mediction is renewed   Labs today and schedule your liver ultrasound (needs done once a year)   See [name] again in 1 year  Nice to meet you

## 2022-09-13 NOTE — Progress Notes (Signed)
[facility]  Patient Active Problem List   Diagnosis Date Noted   Hyperlipidemia 01/12/2022   Coccydynia 06/02/2021   Microcytic anemia 12/24/2020   Elevated BP without diagnosis of hypertension 12/24/2020   Allergic rhinitis 12/24/2020   Chronic active viral hepatitis B (HCC) 11/05/2012   OVERWEIGHT 02/07/2009    Patient's Medications  New Prescriptions   No medications on file  Previous Medications   BENZONATATE (TESSALON) 100 MG CAPSULE    Take 1 capsule (100 mg total) by mouth every 8 (eight) hours.   CALCIUM CARB-CHOLECALCIFEROL (CALCIUM 1000 + D PO)    Take by mouth.   FLUTICASONE (FLONASE) 50 MCG/ACT NASAL SPRAY    Place 1 spray into both nostrils daily.   LEVOCETIRIZINE (XYZAL) 5 MG TABLET    Take 1 tablet (5 mg total) by mouth every evening.   TENOFOVIR ALAFENAMIDE FUMARATE (VEMLIDY) 25 MG TABS    Take 1 tablet (25 mg total) by mouth daily. with food  Modified Medications   No medications on file  Discontinued Medications   No medications on file    Subjective: 47 yo female with chronic hep b dx'ed 2004 during routine testing, here for f/u visit   Patient has been seeing dr Orvan Falconer annually since 2015  I reviewed his previous charts Husband negative for hep b No obvious risk factors other than being born/[place] in [place] 3 children -- 1 son tested positive for hep b as an infant but obtained functional cure; 1 daughter also is hep b positive  2011 initial ?gi evaluation by dr Trey Paula Medoff: hep B e ag positive and viral load > 110 million. Liver biopsy showed minimal hepatitis. Recommended tx with tenofovir, but became pregnant and instead started on telbivudine at start of 3rd trimester.  Patient stopped Telbivudine in 02/2010. 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    Today 09/13/22, doing well Rarely missed dose of tenofovir  Her last hep b labs: 11/2021 Hep b dna not detected Hep b eAg reactive; eAb nonreactive   Her last liver US was on 2014 showed no evidence of hepatoma or cirrhosis.  She has no complaint today otherwise   Review of Systems: Review of Systems  Constitutional:  Negative for chills, diaphoresis, fever, malaise/fatigue and weight loss.  Gastrointestinal:  Negative for abdominal pain, constipation, diarrhea, nausea and vomiting.    Past Medical History:  Diagnosis Date   Allergy    Anemia    BMI 29.0-29.9,adult    GERD (gastroesophageal reflux disease)    Hepatitis B infection    Hepatitis B, chronic (HCC)    Hepatitis B, chronic (HCC)     Social History   Tobacco Use   Smoking status: Never   Smokeless tobacco: Never  Vaping Use   Vaping status: Never Used  Substance Use Topics   Alcohol use: No   Drug use: No    Family History  Problem Relation Age of Onset   Colon cancer Neg Hx    Colon polyps Neg Hx    Esophageal cancer Neg Hx    Rectal cancer Neg Hx    Stomach cancer Neg Hx     No Known Allergies  Objective: Vitals:   09/13/22 1107  Resp: 16  Height: 4\' 9"  (1.448 m)   Body mass index is 29 kg/m.  Physical Exam Constitutional:      Comments: She is in good spirits.  She was examined with the aid of the [facility] interpreter.  Abdominal:     General: There is no distension.     Palpations: Abdomen is soft. There is no mass.     Tenderness: There is no abdominal tenderness.  Neurological:     Mental Status: She is alert and oriented to person, place, and time.     Labs: Lab Results  Component Value Date   WBC 10.0 11/29/2020   HGB 11.5 (L) 11/29/2020   HCT 37.7 11/29/2020   MCV 73.9 (L) 11/29/2020   PLT 326 11/29/2020   Last metabolic panel Lab Results  Component Value Date   GLUCOSE  103 (H) 11/28/2021   NA 139 11/28/2021   K 3.7 11/28/2021   CL 104 11/28/2021   CO2 27 11/28/2021   BUN 12 11/28/2021   CREATININE 0.69 11/28/2021   CALCIUM 9.4 11/28/2021   PROT 8.3 (H) 11/28/2021   ALBUMIN 3.9 05/21/2016   BILITOT 0.3 11/28/2021   ALKPHOS 56 05/21/2016   AST 17 11/28/2021   ALT 13 11/28/2021     Problem List Items Addressed This Visit       Digestive   Chronic active viral hepatitis B (HCC) - Primary   Relevant Orders   Comprehensive metabolic panel   CBC w/Diff   Hepatitis B DNA, Ultraquantitative, PCR   Hepatitis B E Antigen   Hepatitis B E Antibody   Hepatitis delta antibody   US ABDOMEN LIMITED RUQ (LIVER/GB)     Hx via in person language interpreter. 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

## 2022-09-21 ENCOUNTER — Telehealth: Payer: Self-pay

## 2022-09-21 ENCOUNTER — Ambulatory Visit (HOSPITAL_COMMUNITY)
Admission: RE | Admit: 2022-09-21 | Discharge: 2022-09-21 | Disposition: A | Discharge status: Home / Self Care | Payer: 59 | Source: Ambulatory Visit | Attending: Internal Medicine | Admitting: Internal Medicine

## 2022-09-21 DIAGNOSIS — B181 Chronic viral hepatitis B without delta-agent: Secondary | ICD-10-CM | POA: Diagnosis not present

## 2022-09-21 DIAGNOSIS — R932 Abnormal findings on diagnostic imaging of liver and biliary tract: Secondary | ICD-10-CM | POA: Diagnosis not present

## 2022-09-21 NOTE — Telephone Encounter (Signed)
-----   Message from Raymondo Band sent at 09/21/2022 11:50 AM EDT ----- Hi team  Please let her know labs look good  Imaging suggest fatty infiltration and can be improved with diet and exercise. F/u 6 months  thanks

## 2022-09-21 NOTE — Telephone Encounter (Signed)
Called patient to relay results, no answer and no voicemail set up.   [address] [employer] [place] [address]  Patient speaks vietnamese per Dr. Renold Don.   Sandie Ano, RN

## 2022-09-24 NOTE — Telephone Encounter (Signed)
Can you send a mychart message in [place] for the patient? We have tried to contact her twice

## 2022-09-26 ENCOUNTER — Telehealth: Payer: Self-pay

## 2022-09-26 NOTE — Telephone Encounter (Signed)
[place] for [facility] sent to covermymeds. Approved and approval sent to pharmacy. Taniyah Ballow T Pricilla Loveless

## 2022-09-27 ENCOUNTER — Telehealth: Payer: Self-pay

## 2022-09-27 ENCOUNTER — Other Ambulatory Visit (HOSPITAL_COMMUNITY): Payer: Self-pay

## 2022-09-27 NOTE — Telephone Encounter (Signed)
[facility] Patient Advocate Encounter   Received notification from [place] that prior authorization for Wynonia Sours is required.   Aliciana submitted on 09/27/22 Key [id number] Status is pending    [facility] Clinic will continue to follow.   Clearance Coots, CPhT Specialty Pharmacy Patient [facility] Phone: [phone] Fax:  [phone]

## 2022-10-09 ENCOUNTER — Other Ambulatory Visit (HOSPITAL_COMMUNITY): Payer: Self-pay

## 2022-10-30 ENCOUNTER — Ambulatory Visit: Payer: 59 | Admitting: Internal Medicine

## 2022-11-08 ENCOUNTER — Other Ambulatory Visit (HOSPITAL_COMMUNITY): Payer: Self-pay

## 2022-11-08 NOTE — Telephone Encounter (Signed)
Has there been an update on this Tasheika yet?

## 2023-05-02 ENCOUNTER — Telehealth: Payer: Self-pay

## 2023-05-02 NOTE — Telephone Encounter (Signed)
 Initiated Leea for Velimdy 25 MG tablets through covermymeds. Waiting on Jennetta outcome.  Juanita Laster, RMA

## 2023-05-30 ENCOUNTER — Telehealth: Payer: Self-pay

## 2023-05-30 DIAGNOSIS — B181 Chronic viral hepatitis B without delta-agent: Secondary | ICD-10-CM

## 2023-05-30 MED ORDER — TENOFOVIR ALAFENAMIDE FUMARATE 25 MG PO TABS
25.0000 mg | ORAL_TABLET | Freq: Every day | ORAL | 1 refills | Status: DC
Start: 2023-05-30 — End: 2023-10-15

## 2023-05-30 NOTE — Telephone Encounter (Signed)
 Received call from [facility] requesting Vemlidy  Rx be changed from 30 day supply to 90 per insurance preference.   90 day Rx sent in.   Glada Wickstrom, BSN, RN

## 2023-06-13 ENCOUNTER — Other Ambulatory Visit (HOSPITAL_COMMUNITY): Payer: Self-pay

## 2023-08-27 ENCOUNTER — Ambulatory Visit: Payer: 59 | Admitting: Internal Medicine

## 2023-10-10 ENCOUNTER — Encounter: Payer: Self-pay | Admitting: Family Medicine

## 2023-10-10 ENCOUNTER — Ambulatory Visit (INDEPENDENT_AMBULATORY_CARE_PROVIDER_SITE_OTHER): Admitting: Family Medicine

## 2023-10-10 VITALS — BP 138/88 | HR 86 | Ht <= 58 in | Wt 138.2 lb

## 2023-10-10 DIAGNOSIS — R030 Elevated blood-pressure reading, without diagnosis of hypertension: Secondary | ICD-10-CM

## 2023-10-10 DIAGNOSIS — Z23 Encounter for immunization: Secondary | ICD-10-CM

## 2023-10-10 DIAGNOSIS — Z0000 Encounter for general adult medical examination without abnormal findings: Secondary | ICD-10-CM | POA: Diagnosis not present

## 2023-10-10 LAB — POCT GLYCOSYLATED HEMOGLOBIN (HGB A1C): Hemoglobin A1C: 5.3 % (ref 4.0–5.6)

## 2023-10-10 NOTE — Patient Instructions (Signed)
 It was great to see you! Thank you for allowing me to participate in your care!  Our plans for today:  - Please check your blood pressure at home at least twice a week. - I will see you again in 1 month to follow-up on this. - I will let you know the results of your labs.   Please arrive 15 minutes PRIOR to your next scheduled appointment time! If you do not, this affects OTHER patients' care.  Take care and seek immediate care sooner if you develop any concerns.   Ozell Provencal, MD, PGY-3 [facility] 10:21 AM 10/10/2023  [facility]

## 2023-10-10 NOTE — Assessment & Plan Note (Signed)
 Elevated x 2 today in clinic.  Counseled regarding monitoring at home at least 3 times a week.  Does have a blood pressure cuff.  Follow-up 3 weeks with blood pressure log.

## 2023-10-10 NOTE — Progress Notes (Signed)
    SUBJECTIVE:   Chief compliant/HPI: annual examination  Brenda Brady is a 48 y.o. who presents today for an annual exam.   History tabs reviewed and updated.   Review of systems form not filled out secondary to langua  OBJECTIVE:   BP 138/88   Pulse 86   Ht 4' 9 (1.448 m)   Wt 138 lb 3.2 oz (62.7 kg)   SpO2 100%   BMI 29.91 kg/m   General: NAD, well appearing Neuro: A&O, interactive Cardiovascular: RRR, no murmurs,  Respiratory: normal WOB on RA Extremities: Moving all 4 extremities equally   ASSESSMENT/PLAN:   Assessment & Plan Elevated BP without diagnosis of hypertension Elevated x 2 today in clinic.  Counseled regarding monitoring at home at least 3 times a week.  Does have a blood pressure cuff.  Follow-up 3 weeks with blood pressure log. Encounter for immunization Vaccine administered today Annual physical exam See AVS for age appropriate recommendations.   PHQ score 2, negative screen. Blood pressure reviewed and elevated x 2, see below  Considered the following items based upon USPSTF recommendations: Diabetes screening: ordered Screening for elevated cholesterol: ordered HIV testing: discussed and ordered Hepatitis C: discussed and will defer to ID Hepatitis B: Has chronic hepatitis B managed by infectious disease Syphilis if at high risk: not high risk GC/CT not at high risk and not ordered. Reviewed risk factors for latent tuberculosis and not indicated Reviewed risk factors for osteoporosis. Using FRAX tool estimated risk of major osteoporotic fracture of  1.3%, early screening not ordered Metabolic screening performed today with A1c.  Normal at 5.3.  Cervical cancer screening: prior Pap reviewed, repeat due in 1 year Breast cancer screening: discussed and ordered mammogram based upon personal history  Colorectal cancer screening: up to date on screening for CRC. if age 41 or over.   Follow up in 1  year or sooner if indicated.  MyChart Activation:  Already signed up    Ozell Provencal, MD [facility]

## 2023-10-11 ENCOUNTER — Ambulatory Visit: Payer: Self-pay | Admitting: Family Medicine

## 2023-10-11 LAB — LIPID PANEL
Chol/HDL Ratio: 3.9 ratio (ref 0.0–4.4)
Cholesterol, Total: 172 mg/dL (ref 100–199)
HDL: 44 mg/dL (ref >39)
LDL Chol Calc (NIH): 96 mg/dL (ref 0–99)
Triglycerides: 186 mg/dL — ABNORMAL HIGH (ref 0–149)
VLDL Cholesterol Cal: 32 mg/dL (ref 5–40)

## 2023-10-11 LAB — COMPREHENSIVE METABOLIC PANEL WITH GFR
ALT: 15 IU/L (ref 0–32)
AST: 18 IU/L (ref 0–40)
Albumin: 4.2 g/dL (ref 3.9–4.9)
Alkaline Phosphatase: 84 IU/L (ref 44–121)
BUN/Creatinine Ratio: 14 (ref 9–23)
BUN: 10 mg/dL (ref 6–24)
Bilirubin Total: 0.4 mg/dL (ref 0.0–1.2)
CO2: 22 mmol/L (ref 20–29)
Calcium: 9.6 mg/dL (ref 8.7–10.2)
Chloride: 102 mmol/L (ref 96–106)
Creatinine, Ser: 0.72 mg/dL (ref 0.57–1.00)
Globulin, Total: 3.9 g/dL (ref 1.5–4.5)
Glucose: 89 mg/dL (ref 70–99)
Potassium: 4.4 mmol/L (ref 3.5–5.2)
Sodium: 141 mmol/L (ref 134–144)
Total Protein: 8.1 g/dL (ref 6.0–8.5)
eGFR: 104 mL/min/1.73 (ref >59)

## 2023-10-11 LAB — HIV ANTIBODY (ROUTINE TESTING W REFLEX): HIV Screen 4th Generation wRfx: NONREACTIVE

## 2023-10-15 ENCOUNTER — Ambulatory Visit: Admitting: Internal Medicine

## 2023-10-15 ENCOUNTER — Other Ambulatory Visit: Payer: Self-pay

## 2023-10-15 ENCOUNTER — Encounter: Payer: Self-pay | Admitting: Internal Medicine

## 2023-10-15 VITALS — BP 139/86 | HR 79 | Temp 98.0°F | Ht <= 58 in | Wt 137.0 lb

## 2023-10-15 DIAGNOSIS — B181 Chronic viral hepatitis B without delta-agent: Secondary | ICD-10-CM

## 2023-10-15 MED ORDER — VEMLIDY 25 MG PO TABS: 25.0000 mg | ORAL_TABLET | Freq: Every day | ORAL | 11 refills | Status: DC

## 2023-10-15 NOTE — Progress Notes (Signed)
 [facility]  Patient Active Problem List   Diagnosis Date Noted   Hyperlipidemia 01/12/2022   Coccydynia 06/02/2021   Microcytic anemia 12/24/2020   Elevated BP without diagnosis of hypertension 12/24/2020   Allergic rhinitis 12/24/2020   Chronic active viral hepatitis B (HCC) 11/05/2012   Overweight 02/07/2009    Patient's Medications  New Prescriptions   No medications on file  Previous Medications   BENZONATATE  (TESSALON ) 100 MG CAPSULE    Take 1 capsule (100 mg total) by mouth every 8 (eight) hours.   CALCIUM CARB-CHOLECALCIFEROL (CALCIUM 1000 + D PO)    Take by mouth.   FLUTICASONE  (FLONASE ) 50 MCG/ACT NASAL SPRAY    Place 1 spray into both nostrils daily.   LEVOCETIRIZINE (XYZAL ) 5 MG TABLET    Take 1 tablet (5 mg total) by mouth every evening.   TENOFOVIR  ALAFENAMIDE (VEMLIDY ) 25 MG TABLET    Take 1 tablet (25 mg total) by mouth daily.  Modified Medications   No medications on file  Discontinued Medications   No medications on file    Subjective: 48 yo female with chronic hep b dx'ed 2004 during routine testing, here for f/u visit   Patient has been seeing dr Elaine annually since 2015  I reviewed his previous charts Husband negative for hep b No obvious risk factors other than being born/[place] in [place] 3 children -- 1 son tested positive for hep b as an infant but obtained functional cure; 1 daughter also is hep b positive  2011 initial ?gi evaluation by dr Chyrl Medoff: hep B e ag positive and viral load > 110 million. Liver biopsy showed minimal hepatitis. Recommended tx with tenofovir , but became pregnant and instead started on telbivudine  at start of 3rd trimester.  Patient stopped Telbivudine  in 02/2010. 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    Today 09/13/22, doing well Rarely missed dose of tenofovir   Her last hep b labs: 11/2021 Hep b dna not detected Hep b eAg reactive; eAb nonreactive   Her last liver us  was on 2014 showed no evidence of hepatoma or cirrhosis.  She has no complaint today otherwise   Review of Systems: Review of Systems  Constitutional:  Negative for chills, diaphoresis, fever, malaise/fatigue and weight loss.  Gastrointestinal:  Negative for abdominal pain, constipation, diarrhea, nausea and vomiting.    Past Medical History:  Diagnosis Date   Allergy    Anemia    BMI 29.0-29.9,adult    GERD (gastroesophageal reflux disease)    Hepatitis B infection    Hepatitis B, chronic (HCC)    Hepatitis B, chronic (HCC)     Social History   Tobacco Use   Smoking status: Never   Smokeless tobacco: Never  Vaping Use   Vaping status: Never Used  Substance Use Topics   Alcohol use: No   Drug use: No    Family History  Problem Relation Age of Onset   Colon cancer Neg Hx    Colon polyps Neg Hx    Esophageal cancer Neg Hx    Rectal cancer Neg Hx    Stomach cancer Neg Hx     No Known Allergies  Objective: Vitals:   10/15/23 1047  BP: 139/86  Pulse: 79  Temp: 98 F (36.7 C)  TempSrc: Temporal  SpO2: 100%  Weight: 137 lb (62.1 kg)  Height: 4' 9 (1.448 m)   Body mass index is 29.65 kg/m.  Physical Exam Constitutional:      Comments: She is in good spirits.  She was examined with the aid of the [facility] interpreter.  Abdominal:     General: There is no distension.     Palpations: Abdomen is soft. There is no mass.     Tenderness: There is no abdominal tenderness.  Neurological:     Mental Status: She is alert and oriented to person, place, and time.     Labs: Lab Results  Component Value Date   WBC 9.8 09/13/2022   HGB 12.3 09/13/2022   HCT 40.2 09/13/2022   MCV 71.5 (L) 09/13/2022   PLT 356  09/13/2022   Last metabolic panel Lab Results  Component Value Date   GLUCOSE 89 10/10/2023   NA 141 10/10/2023   K 4.4 10/10/2023   CL 102 10/10/2023   CO2 22 10/10/2023   BUN 10 10/10/2023   CREATININE 0.72 10/10/2023   EGFR 104 10/10/2023   CALCIUM 9.6 10/10/2023   PROT 8.1 10/10/2023   ALBUMIN 4.2 10/10/2023   LABGLOB 3.9 10/10/2023   BILITOT 0.4 10/10/2023   ALKPHOS 84 10/10/2023   AST 18 10/10/2023   ALT 15 10/10/2023   Imaging: Reviewed  2016 elastography Negative abdomen ultrasound.   Median hepatic shear wave velocity is calculated at 1.75 m/sec.   Corresponding Metavir fibrosis score is F2 +some F3.   Risk of fibrosis is moderate.   Follow-up:  Additional testing appropriate   09/2022 liver u/s 1. Increased echotexture of the liver. This is a nonspecific finding but can be seen in fatty infiltration. 2. No focal lesion      Problem List Items Addressed This Visit   None Visit Diagnoses       Chronic viral hepatitis B without delta agent and without coma (HCC)    -  Primary         Hx via in person language interpreter. 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

## 2023-10-15 NOTE — Patient Instructions (Signed)
 Please schedule liver ultrasound annually again  Blood test today    Your previous u/s suggest mild inflammatoin and fatty liver (which if left untreated for long could cause cirrhosis like  hep b)  Consider engaging in regular aerobic exercise     Renew tenofovir  today

## 2023-10-18 ENCOUNTER — Ambulatory Visit (HOSPITAL_COMMUNITY)
Admission: RE | Admit: 2023-10-18 | Discharge: 2023-10-18 | Disposition: A | Discharge status: Home / Self Care | Source: Ambulatory Visit | Attending: Internal Medicine | Admitting: Internal Medicine

## 2023-10-18 DIAGNOSIS — B181 Chronic viral hepatitis B without delta-agent: Secondary | ICD-10-CM | POA: Insufficient documentation

## 2023-10-18 LAB — HEPATITIS B E ANTIBODY: Hep B E Ab: NONREACTIVE

## 2023-10-18 LAB — HEPATITIS B SURFACE ANTIBODY, QUANTITATIVE: Hep B S AB Quant (Post): <5 m[IU]/mL — ABNORMAL LOW (ref >=10)

## 2023-10-18 LAB — HEPATITIS B DNA, ULTRAQUANTITATIVE, PCR
Hepatitis B DNA: NOT DETECTED [IU]/mL
Hepatitis B virus DNA: NOT DETECTED {Log_IU}/mL

## 2023-10-18 LAB — HEPATITIS B E ANTIGEN: Hep B E Ag: REACTIVE — AB

## 2023-10-18 LAB — HEPATITIS B SURFACE ANTIGEN: Hepatitis B Surface Ag: REACTIVE — AB

## 2023-10-21 ENCOUNTER — Ambulatory Visit
Admission: RE | Admit: 2023-10-21 | Discharge: 2023-10-21 | Disposition: A | Discharge status: Home / Self Care | Source: Ambulatory Visit | Attending: Family Medicine | Admitting: Family Medicine

## 2023-10-21 DIAGNOSIS — Z0000 Encounter for general adult medical examination without abnormal findings: Secondary | ICD-10-CM

## 2023-10-23 ENCOUNTER — Encounter: Payer: Self-pay | Admitting: Family Medicine

## 2023-10-29 ENCOUNTER — Ambulatory Visit: Admitting: Family Medicine

## 2023-10-29 ENCOUNTER — Encounter: Payer: Self-pay | Admitting: Family Medicine

## 2023-10-29 VITALS — BP 131/87 | HR 79 | Ht <= 58 in | Wt 135.0 lb

## 2023-10-29 DIAGNOSIS — I10 Essential (primary) hypertension: Secondary | ICD-10-CM

## 2023-10-29 MED ORDER — AMLODIPINE BESYLATE 5 MG PO TABS
5.0000 mg | ORAL_TABLET | Freq: Every day | ORAL | 0 refills | Status: DC
Start: 2023-10-29 — End: 2023-11-25

## 2023-10-29 NOTE — Progress Notes (Signed)
    SUBJECTIVE:   CHIEF COMPLAINT / HPI: BP f/u  Unable to reach pacific interpreter or provided inperson interpreter's phone number. Visit conveyed to the best of our ability, through patient, provider and patient's partner.  Here to f/u up on BP Brings in BP log 130/90s. Okay with starting a medicine.  Ask about labs from last time Discussed normal liver labs and abdomen US  ordered by ID Discussed fatty liver and how to improve liver health  PERTINENT  PMH / PSH: Chronic Hep B, HLD  OBJECTIVE:   BP 131/87   Pulse 79   Ht 4' 9 (1.448 m)   Wt 135 lb (61.2 kg)   LMP 08/12/2023   SpO2 100%   BMI 29.21 kg/m   General: NAD, well appearing Neuro: A&O Respiratory: normal WOB on RA Extremities: Moving all 4 extremities equally   ASSESSMENT/PLAN:   Assessment & Plan Essential hypertension Mildly elevated diastolic and systolic blood pressures through multiple clinic visits and on BP log. Initiate antihypertensive therapy. - Amlodipine  5mg  daily  Return in about 1 month (around 11/28/2023) for BP f/u.  Ozell Provencal, MD [facility]

## 2023-10-29 NOTE — Patient Instructions (Signed)
 It was great to see you! Thank you for allowing me to participate in your care!  Our plans for today:  - I have started you on a medicine called amlodipine , this is to help your blood pressure. - Please continue checking your blood pressure at home. - I will see you again in 1 month, please bring in all of your medicines and bring in a blood pressure log at that time.   Please arrive 15 minutes PRIOR to your next scheduled appointment time! If you do not, this affects OTHER patients' care.  Take care and seek immediate care sooner if you develop any concerns.   Brenda Provencal, MD, PGY-3 [facility] 9:42 AM 10/29/2023  [facility]

## 2023-10-29 NOTE — Assessment & Plan Note (Signed)
 Mildly elevated diastolic and systolic blood pressures through multiple clinic visits and on BP log. Initiate antihypertensive therapy. - Amlodipine  5mg  daily

## 2023-11-25 ENCOUNTER — Ambulatory Visit: Admitting: Family Medicine

## 2023-11-25 DIAGNOSIS — I10 Essential (primary) hypertension: Secondary | ICD-10-CM | POA: Diagnosis present

## 2023-11-25 MED ORDER — AMLODIPINE BESYLATE 5 MG PO TABS
5.0000 mg | ORAL_TABLET | Freq: Every day | ORAL | 1 refills | Status: AC
Start: 2023-11-25 — End: ?

## 2023-11-25 NOTE — Progress Notes (Signed)
    SUBJECTIVE:   CHIEF COMPLAINT / HPI: f/u BP  Discussed the use of AI scribe software for clinical note transcription with the patient, who gave verbal consent to proceed.  History of Present Illness Brenda Brady is a 48 year old female with hypertension who presents for a routine follow-up.  Blood pressure management - Blood pressure readings range from 110/70 mmHg to 100/70 mmHg with regular monitoring - Stable on amlodipine  therapy - No dizziness or symptoms of hypotension - Did not bring in medications today    PERTINENT  PMH / PSH: HTN, HLD  OBJECTIVE:   LMP 08/12/2023   Physical Exam General: NAD, well appearing Neuro: A&O Respiratory: normal WOB on RA Extremities: Moving all 4 extremities equally   ASSESSMENT/PLAN:   Assessment & Plan Essential hypertension Essential hypertension Blood pressure controlled with amlodipine , readings stable at 110/70 mmHg. - Refilled amlodipine  5 mg daily for 6 months. - Advised monitoring for hypotension symptoms, especially dizziness or BP <90/60 mmHg. - Instructed to check BP once or twice weekly.  Return in about 6 months (around 05/25/2024).  Precepted with Dr. Teressa Ozell Provencal, MD, PGY-3 [facility] 11:28 AM 11/25/2023  [facility]

## 2023-11-25 NOTE — Patient Instructions (Signed)
 It was great to see you! Thank you for allowing me to participate in your care!  Our plans for today:   VISIT SUMMARY: You had a routine follow-up visit to check on your blood pressure management. Your blood pressure is well-controlled with your current medication, and you are not experiencing any symptoms of low blood pressure.  YOUR PLAN: ESSENTIAL HYPERTENSION: Your blood pressure is well-controlled with your current medication, amlodipine . -Continue taking amlodipine  5 mg daily. Your prescription has been refilled for 6 months. -Monitor your blood pressure once or twice a week. -Watch for symptoms of low blood pressure, such as dizziness or readings below 90/60 mmHg. -Schedule a follow-up appointment in 6 months. -Return to the clinic if you experience dizziness or if your blood pressure readings are below 90/60 mmHg.     Please arrive 15 minutes PRIOR to your next scheduled appointment time! If you do not, this affects OTHER patients' care.  Take care and seek immediate care sooner if you develop any concerns.   Ozell Provencal, MD, PGY-3 [facility] 11:18 AM 11/25/2023  [facility]

## 2023-11-25 NOTE — Assessment & Plan Note (Signed)
 Essential hypertension Blood pressure controlled with amlodipine , readings stable at 110/70 mmHg. - Refilled amlodipine  5 mg daily for 6 months. - Advised monitoring for hypotension symptoms, especially dizziness or BP <90/60 mmHg. - Instructed to check BP once or twice weekly.

## 2023-12-17 ENCOUNTER — Other Ambulatory Visit (HOSPITAL_COMMUNITY)
Admission: RE | Admit: 2023-12-17 | Discharge: 2023-12-17 | Disposition: A | Discharge status: Home / Self Care | Source: Ambulatory Visit | Attending: Family Medicine | Admitting: Family Medicine

## 2023-12-17 ENCOUNTER — Ambulatory Visit: Payer: Self-pay | Admitting: Family Medicine

## 2023-12-17 ENCOUNTER — Ambulatory Visit: Admitting: Family Medicine

## 2023-12-17 VITALS — BP 130/89 | HR 76 | Ht <= 58 in | Wt 135.2 lb

## 2023-12-17 DIAGNOSIS — N898 Other specified noninflammatory disorders of vagina: Secondary | ICD-10-CM | POA: Insufficient documentation

## 2023-12-17 DIAGNOSIS — M67921 Unspecified disorder of synovium and tendon, right upper arm: Secondary | ICD-10-CM

## 2023-12-17 DIAGNOSIS — I10 Essential (primary) hypertension: Secondary | ICD-10-CM

## 2023-12-17 LAB — POCT WET PREP (WET MOUNT)
Clue Cells Wet Prep Whiff POC: NEGATIVE
Trichomonas Wet Prep HPF POC: ABSENT
WBC, Wet Prep HPF POC: >20

## 2023-12-17 MED ORDER — FLUCONAZOLE 150 MG PO TABS
150.0000 mg | ORAL_TABLET | Freq: Once | ORAL | 0 refills | Status: AC
Start: 2023-12-17 — End: 2023-12-17

## 2023-12-17 NOTE — Assessment & Plan Note (Addendum)
 Looks like yeast based on wet prep.  - Diflucan  x 2 sent to the pharmacy  - follow up GC, CT, trich

## 2023-12-17 NOTE — Progress Notes (Signed)
    SUBJECTIVE:   CHIEF COMPLAINT / HPI:   Vaginal irritation  - Sense of pruritus for the last 4 weeks  - has been worsening  - tried vagisil (there are many different ones on the market with different ingredients) which would help momentarily  - One sexual partner (her husband), says there is no way she could be pregnant  - Has not had STIs previously  - Hurts externally when she urinates  - No abdominal pain  - No recent antibiotics  - Does not get yeast or BV frequently   Hypertension  - Takes amlodipine  , took it last night  - Last visit less than a month ago BP was 110/70  - Has not missed any doses   Shoulder pain  - for years, pain on her shoulder and upper arm on right side  - 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  - feels like she cannot lift heavy things with right arm anymore  - Takes ibuprofen BID once a week prn   PERTINENT  PMH / PSH: Chronic active hep b well controlled   OBJECTIVE:   BP 130/89   Pulse 76   Ht 4' 9 (1.448 m)   Wt 135 lb 3.2 oz (61.3 kg)   SpO2 100%   BMI 29.26 kg/m   General: well appearing, in no acute distress Resp: Normal work of breathing on room air Abd: Soft, non tender, non distended  Neuro: Alert & Oriented x 4  GU (chaperoned by dayshia CMA): no vulvar lesions, minimal trace menstrual blood in introitus, some thick chunky white discharge in the canal near cervix, No CMT, no cervical lesions visible   MSK: symmetric appearance of shoulders and arms bilaterally. Full range of motion about shoulder, elbow and wrist.  No tenderness to palpation of shoulder or biceps.  Mild tenderness to palpation of distal biceps tendon. Worsens with supination against resistance.    ASSESSMENT/PLAN:   Assessment & Plan Vaginal irritation Looks like yeast based on wet prep.  - Diflucan  x 2 sent to the pharmacy  - follow up GC, CT, trich Essential hypertension Elevated above goal today. BP usually on the lower side at other visits. Will  avoid increasing medication to avoid hypotension given previous pressure readings.  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  - continue with current use of ibuprofen (once a week), recommended voltaren  gel  - Sports medicine referral for possible ultrasound      Areta Saliva, MD [facility]

## 2023-12-17 NOTE — Assessment & Plan Note (Signed)
 Elevated above goal today. BP usually on the lower side at other visits. Will avoid increasing medication to avoid hypotension given previous pressure readings.  - Recommended blood pressure log  - Follow up for BP recheck in 2-4 weeks

## 2023-12-17 NOTE — Patient Instructions (Addendum)
 It was wonderful to see you today.  Please bring ALL of your medications with you to every visit.   Today we talked about:  Vaginal irritation - I will let you know the results of the tests. Please stop using vagisil. This will not treat the irritation and can just make symptoms better. Can sometimes worsen an infection.   Right arm pain - I believe you have some inflammation near a tendon in your biceps. 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 You can use voltaren  gel over the counter and continue to use ibuprofen if it is only once a week. I have referred you to the sports medicine center.   Thank you for choosing [facility].   Please call [phone] with any questions about today's appointment.  Areta Saliva, MD  Family Medicine

## 2023-12-18 LAB — CERVICOVAGINAL ANCILLARY ONLY
Chlamydia: NEGATIVE
Comment: NEGATIVE
Comment: NEGATIVE
Comment: NORMAL
Neisseria Gonorrhea: NEGATIVE
Trichomonas: NEGATIVE

## 2023-12-23 ENCOUNTER — Other Ambulatory Visit (HOSPITAL_COMMUNITY): Payer: Self-pay

## 2023-12-23 ENCOUNTER — Ambulatory Visit (INDEPENDENT_AMBULATORY_CARE_PROVIDER_SITE_OTHER): Admitting: Family Medicine

## 2023-12-23 ENCOUNTER — Telehealth: Payer: Self-pay

## 2023-12-23 VITALS — BP 120/86 | Ht <= 58 in | Wt 132.0 lb

## 2023-12-23 DIAGNOSIS — M79601 Pain in right arm: Secondary | ICD-10-CM | POA: Diagnosis present

## 2023-12-23 MED ORDER — MELOXICAM 15 MG PO TABS: 15.0000 mg | ORAL_TABLET | Freq: Every day | ORAL | 1 refills | Status: AC

## 2023-12-23 NOTE — Progress Notes (Signed)
 PCP: Alba Sharper, MD  Patient is a 48 y.o. female here for right arm pain. Appreciate assistance of in-person interpreter during this visit.  HPI Ongoing 1-2 months. Upper and lower arm muscles, some neck pain. No discomfort in office today. Sometimes shoulder hurts after lying on it; will also have radiating numbness/tingling to fingertips after lying on right side at night. Otherwise does not experience this. Nail technician, right handed. No acute injury or trauma.  Past Medical History:  Diagnosis Date   Allergy    Anemia    BMI 29.0-29.9,adult    GERD (gastroesophageal reflux disease)    Hepatitis B infection    Hepatitis B, chronic (HCC)    Hepatitis B, chronic (HCC)     Current Outpatient Medications on File Prior to Visit  Medication Sig Dispense Refill   amLODipine  (NORVASC ) 5 MG tablet Take 1 tablet (5 mg total) by mouth at bedtime. 90 tablet 1   Calcium Carb-Cholecalciferol (CALCIUM 1000 + D PO) Take by mouth.     fluticasone  (FLONASE ) 50 MCG/ACT nasal spray Place 1 spray into both nostrils daily. 16 mL 0   levocetirizine (XYZAL ) 5 MG tablet Take 1 tablet (5 mg total) by mouth every evening. 30 tablet 0   tenofovir  alafenamide (VEMLIDY ) 25 MG tablet Take 1 tablet (25 mg total) by mouth daily. 30 tablet 11   No current facility-administered medications on file prior to visit.    Past Surgical History:  Procedure Laterality Date   COLONOSCOPY  03/14/2022   VAGINAL DELIVERY      No Known Allergies  BP 120/86   Ht 4' 9 (1.448 m)   Wt 132 lb (59.9 kg)   BMI 28.56 kg/m       No data to display              No data to display              Objective:  Physical Exam: Gen: NAD, comfortable in exam room Neck: Negative Spurling's bilaterally.  Right Shoulder Exam Inspection: No gross deformity, swelling, ecchymosis. Palpation: No TTP over the [facility] joint.  Hypertonicity of right trapezius muscle. ROM: Full ROM intact, normal strength. Special  Tests: Negative Hawkins, Neer's, empty can test. Neurovascularly intact distally.  Right Arm/Elbow Exam Inspection: No notable deformity, effusion, or erythema. Palpation: No TTP of the lateral/medial epicondyles. ROM: FROM with normal strength.  Sensation intact to light touch bilaterally.  Assessment and Plan:  Right arm pain -exam and history consistent with muscle overuse.  No findings to suggest shoulder or elbow joint pathology.  She would benefit from short course of meloxicam daily for 1 week to reduce inflammation, then used as needed.  Discussed physical therapy, she would prefer to try physical therapy exercises at home, so these were provided today.  Follow-up in 6 weeks or as needed.

## 2023-12-23 NOTE — Patient Instructions (Addendum)
 Your exam is reassuring. You have overuse strains of your rotator cuff, biceps, and trapezius muscles. Do home exercises/stretches most days of the week. If you want to do physical therapy just give me a call and we will put the referral in. Meloxicam 15mg  daily with food for pain and inflammation - take for 7 days regularly then just as needed. Don't take aleve  or ibuprofen while on this medication. Follow up with me in 6 weeks but call sooner if you're struggling.

## 2023-12-24 ENCOUNTER — Other Ambulatory Visit: Payer: Self-pay

## 2023-12-24 ENCOUNTER — Telehealth: Payer: Self-pay

## 2023-12-24 MED ORDER — ENTECAVIR 0.5 MG PO TABS: 0.5000 mg | ORAL_TABLET | ORAL | 11 refills | Status: AC

## 2023-12-24 NOTE — Telephone Encounter (Signed)
 Hello Dr. Overton,   Just spoke to patient regarding the switch from Vemlidy  to Entecavir due to insurance purposes. Patient informed to continue the Vemlidy  until they receive the new prescription of Entecavir. Will send the prescription into [facility] for Entecavir 0.5 mg daily.   Patient counseled to take Entecavir one tablet daily on an empty stomach (2 hours before or 2 hours after a meal). Patient counseled on potential side effects (such as dizziness, nausea, fatigue, headache) which should subside after 1-2 weeks. No significant drug-interactions after review of medication list. I also provided [facility] pharmacy number [phone]) to patient and informed to reach out to them if she does not hear from them over the next few days.   Please let me know if you have any questions/concerns!  Thank you,  Feliciano

## 2023-12-24 NOTE — Telephone Encounter (Signed)
 Pharmacy Patient Advocate Encounter   Received notification from [employer] that prior authorization for VEMLIDY   is required/requested.   Insurance verification completed.   The patient is insured through [facility] MEDICAID.   Per test claim: Brenda Brady required; Brenda Brady submitted to above mentioned insurance via [employer] Key/confirmation #/EOC [id number] Status is pending

## 2023-12-24 NOTE — Telephone Encounter (Signed)
 Pharmacy Patient Advocate Encounter  Received notification from [facility] MEDICAID that Prior Authorization for VEMLIDY   has been CANCELLED due to PROVIDER CANCELING IT    Tinsley #/Case ID/Reference #: [id number]

## 2024-02-03 ENCOUNTER — Ambulatory Visit: Admitting: Family Medicine

## 2024-02-03 VITALS — BP 122/88 | Ht <= 58 in | Wt 132.0 lb

## 2024-02-03 DIAGNOSIS — M79601 Pain in right arm: Secondary | ICD-10-CM

## 2024-02-03 NOTE — Progress Notes (Signed)
 PCP: Alba Sharper, MD  Discussed the use of AI scribe software for clinical note transcription with the patient, who gave verbal consent to proceed.  History of Present Illness Brenda Brady is a 48 year old female with right arm pain and swelling who presents for follow-up evaluation of right arm symptoms.  Interpreter present for today's visit.  Right arm pain - Pain has significantly improved - Currently intermittent, mild, and self-resolving - No constant pain at rest - Able to lift objects and perform cleans without pain  Right arm swelling - Swelling is intermittent and not consistently present  Neurological symptoms - No numbness or paresthesia  Medication use - Stopped daily meloxicam  - Uses meloxicam  only as needed, but has not needed it recently  Past Medical History:  Diagnosis Date   Allergy    Anemia    BMI 29.0-29.9,adult    GERD (gastroesophageal reflux disease)    Hepatitis B infection    Hepatitis B, chronic (HCC)    Hepatitis B, chronic (HCC)     Medications Ordered Prior to Encounter[1]  Past Surgical History:  Procedure Laterality Date   COLONOSCOPY  03/14/2022   VAGINAL DELIVERY      Allergies[2]  BP 122/88   Ht 4' 9 (1.448 m)   Wt 132 lb (59.9 kg)   BMI 28.56 kg/m       No data to display              No data to display              Objective:  Physical Exam:  Gen: NAD, comfortable in exam room  Right arm: No deformity. FROM with 5/5 strength shoulder, elbow, wrist. No tenderness to palpation. NVI distally. Assessment & Plan Right arm pain Right arm pain significantly improved, now mild and intermittent. Conservative management effective, condition resolving. - Continue home exercise program three times weekly for several weeks, then discontinue. - Use meloxicam  as needed for analgesia. - Reassured regarding improvement and anticipated full recovery. - Instructed to contact clinic if symptoms worsen or concerns  arise. - No follow-up appointment unless clinically indicated.     [1]  Current Outpatient Medications on File Prior to Visit  Medication Sig Dispense Refill   amLODipine  (NORVASC ) 5 MG tablet Take 1 tablet (5 mg total) by mouth at bedtime. 90 tablet 1   Calcium Carb-Cholecalciferol (CALCIUM 1000 + D PO) Take by mouth.     entecavir  (BARACLUDE ) 0.5 MG tablet Take 1 tablet (0.5 mg total) by mouth daily. Please take one tablet daily on an empty stomach (2 hours before or 2 hours after food). 30 tablet 11   fluticasone  (FLONASE ) 50 MCG/ACT nasal spray Place 1 spray into both nostrils daily. 16 mL 0   levocetirizine (XYZAL ) 5 MG tablet Take 1 tablet (5 mg total) by mouth every evening. 30 tablet 0   meloxicam  (MOBIC ) 15 MG tablet Take 1 tablet (15 mg total) by mouth daily. 30 tablet 1   No current facility-administered medications on file prior to visit.  [2] No Known Allergies

## 2024-10-27 ENCOUNTER — Ambulatory Visit: Admitting: Internal Medicine
